# Patient Record
Sex: Male | Born: 1970 | Race: White | Hispanic: No | Marital: Married | Smoking: Current every day smoker
Health system: Southern US, Community
[De-identification: ages and names within clinical notes are randomized; demographics above are authoritative.]

## PROBLEM LIST (undated history)

## (undated) DIAGNOSIS — Z7901 Long term (current) use of anticoagulants: Secondary | ICD-10-CM

## (undated) DIAGNOSIS — I2699 Other pulmonary embolism without acute cor pulmonale: Secondary | ICD-10-CM

## (undated) DIAGNOSIS — K760 Fatty (change of) liver, not elsewhere classified: Secondary | ICD-10-CM

## (undated) HISTORY — PX: COLONOSCOPY: SHX174

## (undated) HISTORY — DX: Long term (current) use of anticoagulants: Z79.01

## (undated) HISTORY — PX: CHEST TUBE INSERTION: SHX231

## (undated) HISTORY — DX: Other pulmonary embolism without acute cor pulmonale: I26.99

## (undated) HISTORY — DX: Fatty (change of) liver, not elsewhere classified: K76.0

---

## 2002-09-22 ENCOUNTER — Emergency Department (HOSPITAL_COMMUNITY): Admission: EM | Admit: 2002-09-22 | Discharge: 2002-09-22 | Payer: Self-pay | Admitting: Emergency Medicine

## 2003-09-02 ENCOUNTER — Emergency Department (HOSPITAL_COMMUNITY): Admission: EM | Admit: 2003-09-02 | Discharge: 2003-09-02 | Payer: Self-pay | Admitting: *Deleted

## 2003-09-02 ENCOUNTER — Emergency Department (HOSPITAL_COMMUNITY): Admission: EM | Admit: 2003-09-02 | Discharge: 2003-09-02 | Payer: Self-pay | Admitting: Emergency Medicine

## 2003-10-02 ENCOUNTER — Emergency Department (HOSPITAL_COMMUNITY): Admission: EM | Admit: 2003-10-02 | Discharge: 2003-10-02 | Payer: Self-pay | Admitting: Emergency Medicine

## 2003-10-07 ENCOUNTER — Emergency Department (HOSPITAL_COMMUNITY): Admission: EM | Admit: 2003-10-07 | Discharge: 2003-10-07 | Payer: Self-pay | Admitting: Emergency Medicine

## 2005-02-21 ENCOUNTER — Inpatient Hospital Stay (HOSPITAL_COMMUNITY): Admission: EM | Admit: 2005-02-21 | Discharge: 2005-03-08 | Payer: Self-pay | Admitting: Emergency Medicine

## 2005-02-28 ENCOUNTER — Encounter (INDEPENDENT_AMBULATORY_CARE_PROVIDER_SITE_OTHER): Payer: Self-pay | Admitting: Specialist

## 2005-02-28 ENCOUNTER — Ambulatory Visit: Payer: Self-pay | Admitting: Emergency Medicine

## 2005-03-04 ENCOUNTER — Encounter: Payer: Self-pay | Admitting: Thoracic Surgery

## 2005-03-14 ENCOUNTER — Ambulatory Visit: Payer: Self-pay | Admitting: Emergency Medicine

## 2005-03-18 ENCOUNTER — Ambulatory Visit: Payer: Self-pay | Admitting: Cardiology

## 2005-03-20 ENCOUNTER — Encounter: Admission: RE | Admit: 2005-03-20 | Discharge: 2005-03-20 | Payer: Self-pay | Admitting: Thoracic Surgery

## 2005-03-22 ENCOUNTER — Ambulatory Visit: Payer: Self-pay | Admitting: *Deleted

## 2005-03-29 ENCOUNTER — Ambulatory Visit: Payer: Self-pay | Admitting: Cardiology

## 2005-04-09 ENCOUNTER — Encounter: Admission: RE | Admit: 2005-04-09 | Discharge: 2005-04-09 | Payer: Self-pay | Admitting: Thoracic Surgery

## 2005-04-11 ENCOUNTER — Ambulatory Visit: Payer: Self-pay | Admitting: Cardiology

## 2005-05-02 ENCOUNTER — Ambulatory Visit: Payer: Self-pay | Admitting: Cardiology

## 2005-05-22 ENCOUNTER — Ambulatory Visit (HOSPITAL_COMMUNITY): Admission: RE | Admit: 2005-05-22 | Discharge: 2005-05-22 | Payer: Self-pay | Admitting: Thoracic Surgery

## 2005-05-23 ENCOUNTER — Ambulatory Visit: Payer: Self-pay | Admitting: *Deleted

## 2005-06-12 ENCOUNTER — Encounter: Admission: RE | Admit: 2005-06-12 | Discharge: 2005-06-12 | Payer: Self-pay | Admitting: Thoracic Surgery

## 2005-06-26 ENCOUNTER — Ambulatory Visit: Payer: Self-pay | Admitting: *Deleted

## 2005-07-31 ENCOUNTER — Ambulatory Visit: Payer: Self-pay | Admitting: Cardiology

## 2005-08-28 ENCOUNTER — Ambulatory Visit: Payer: Self-pay | Admitting: *Deleted

## 2005-09-12 ENCOUNTER — Ambulatory Visit: Payer: Self-pay | Admitting: *Deleted

## 2005-09-18 ENCOUNTER — Encounter: Admission: RE | Admit: 2005-09-18 | Discharge: 2005-09-18 | Payer: Self-pay | Admitting: Thoracic Surgery

## 2005-10-18 ENCOUNTER — Ambulatory Visit: Payer: Self-pay | Admitting: Cardiology

## 2005-11-03 ENCOUNTER — Emergency Department (HOSPITAL_COMMUNITY): Admission: EM | Admit: 2005-11-03 | Discharge: 2005-11-03 | Payer: Self-pay | Admitting: Emergency Medicine

## 2005-11-19 ENCOUNTER — Ambulatory Visit: Payer: Self-pay | Admitting: Cardiology

## 2005-12-18 ENCOUNTER — Ambulatory Visit: Payer: Self-pay | Admitting: *Deleted

## 2006-01-22 ENCOUNTER — Ambulatory Visit: Payer: Self-pay | Admitting: Cardiology

## 2006-02-24 ENCOUNTER — Ambulatory Visit: Payer: Self-pay | Admitting: Cardiology

## 2006-03-25 ENCOUNTER — Ambulatory Visit: Payer: Self-pay | Admitting: Cardiology

## 2006-04-22 ENCOUNTER — Ambulatory Visit: Payer: Self-pay | Admitting: Cardiology

## 2006-05-08 ENCOUNTER — Ambulatory Visit: Payer: Self-pay | Admitting: Cardiology

## 2006-06-16 ENCOUNTER — Ambulatory Visit: Payer: Self-pay | Admitting: Cardiovascular Disease

## 2006-07-09 ENCOUNTER — Ambulatory Visit: Payer: Self-pay | Admitting: Cardiology

## 2006-08-08 ENCOUNTER — Ambulatory Visit: Payer: Self-pay | Admitting: Cardiology

## 2006-09-12 ENCOUNTER — Ambulatory Visit: Payer: Self-pay | Admitting: Cardiology

## 2006-10-16 ENCOUNTER — Ambulatory Visit: Payer: Self-pay | Admitting: Internal Medicine

## 2006-11-05 IMAGING — US US EXTREM LOW VENOUS BILAT
1 series · 13 of 24 positions shown · non-contrast
Comparison: none

CLINICAL DATA: History of pulmonary embolus with loculated right pleural effusion.  Diagnostic sample requested.  
ULTRASOUND CHEST:
CLINICAL DATA: Pulmonary embolus.  Swelling and leg pain. 
BILATERAL LOWER EXTREMITY VENOUS DOPPLER ULTRASOUND:
TECHNIQUE: Gray-scale sonography with compression, as well as color and duplex Doppler ultrasound, were performed to evaluate the deep venous system from the level of the common femoral vein through the popliteal and proximal calf veins.

[Series 1: unknown · 13 of 41 slices shown]
[im 1/41]
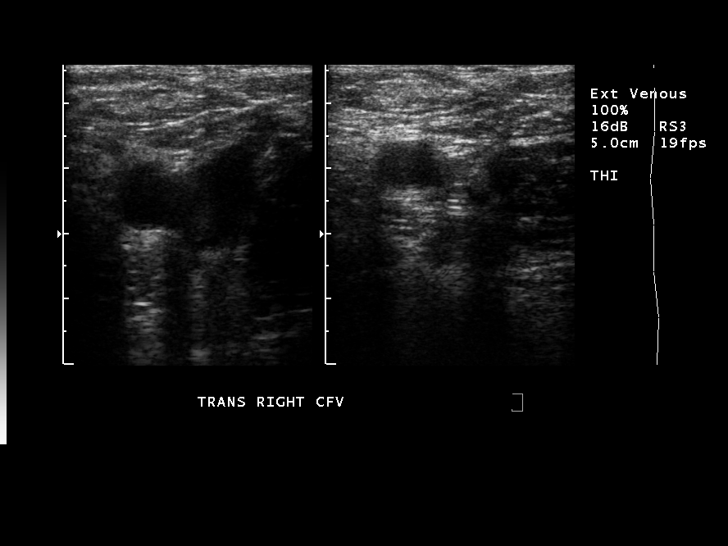
[im 4/41]
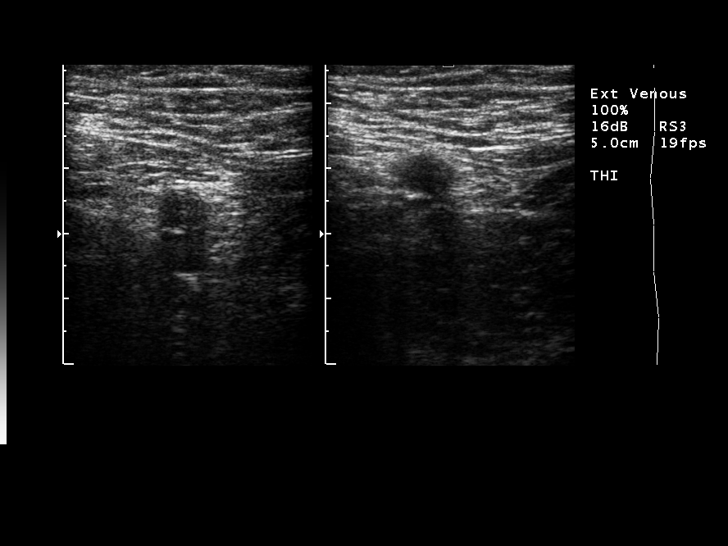
[im 7/41]
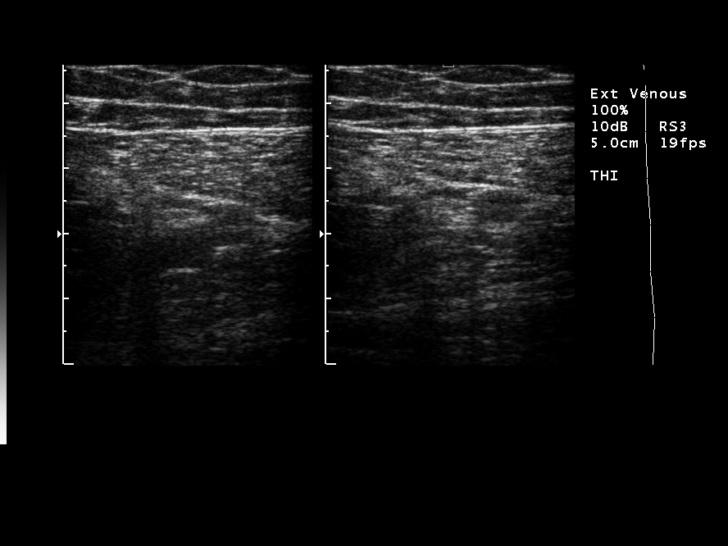
[im 11/41]
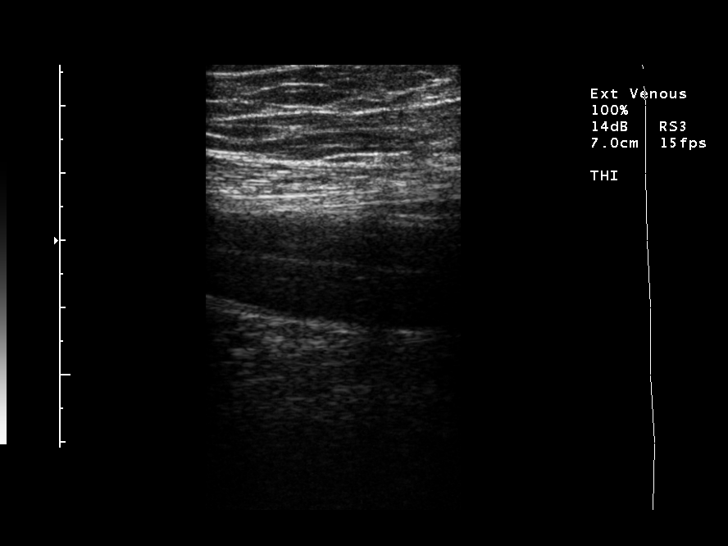
[im 14/41]
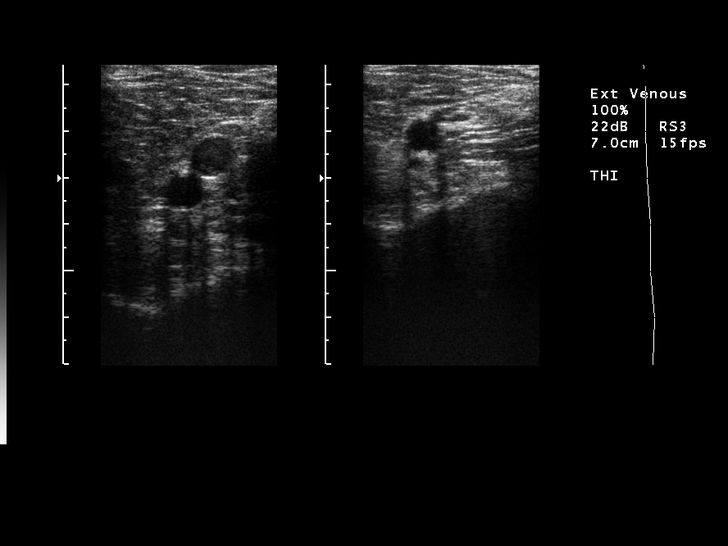
[im 18/41]
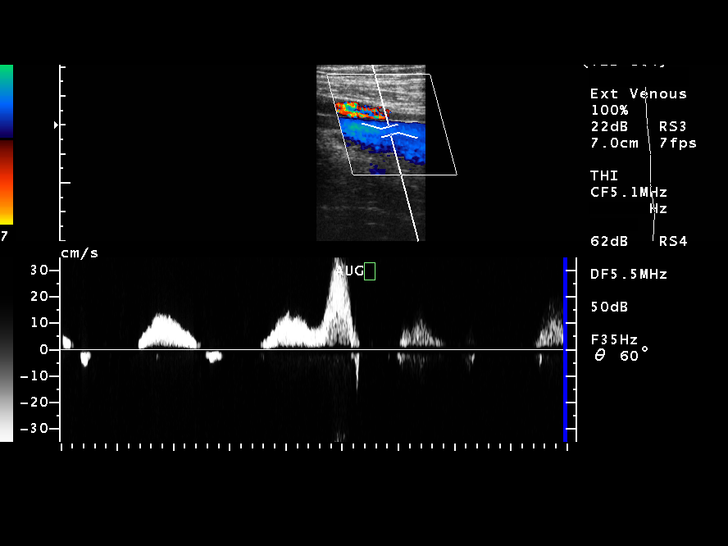
[im 21/41]
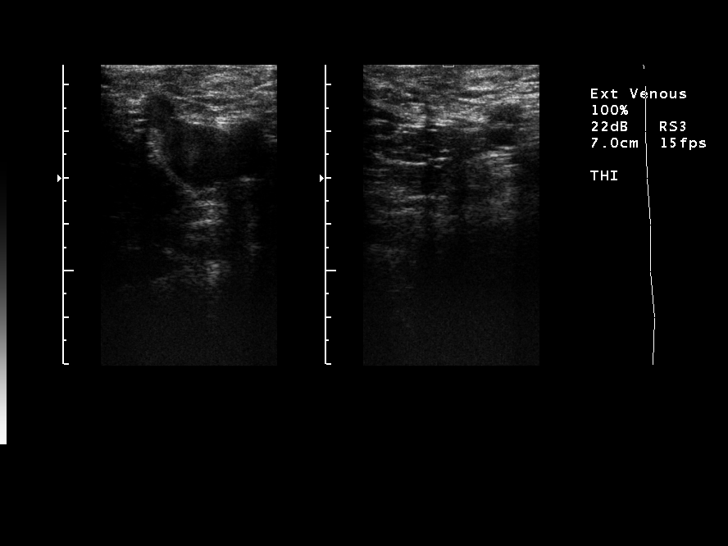
[im 23/41]
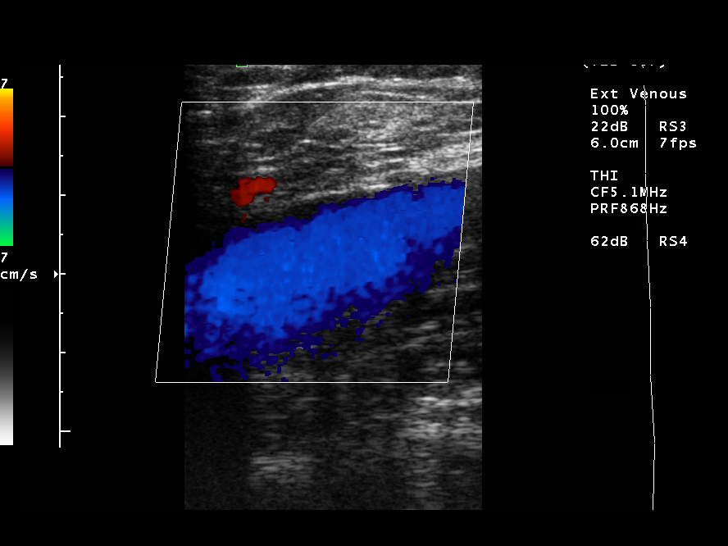
[im 27/41]
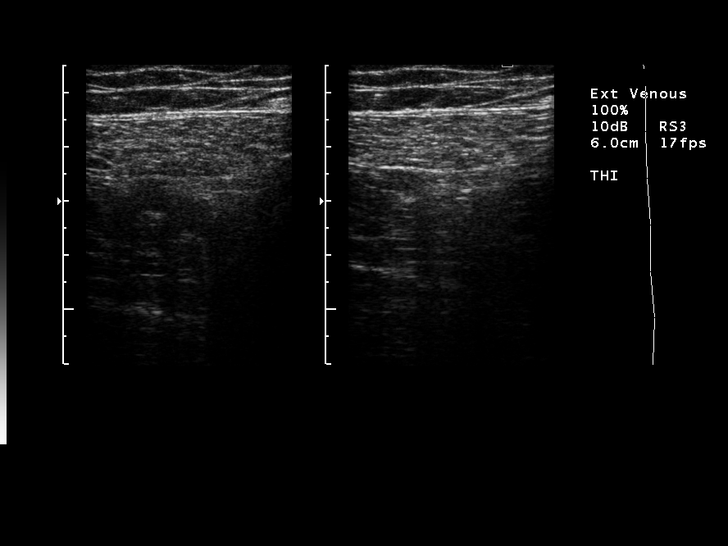
[im 30/41]
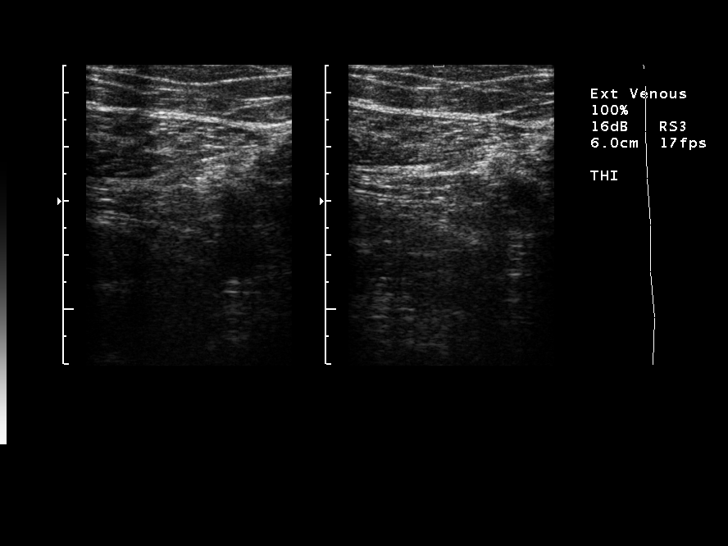
[im 34/41]
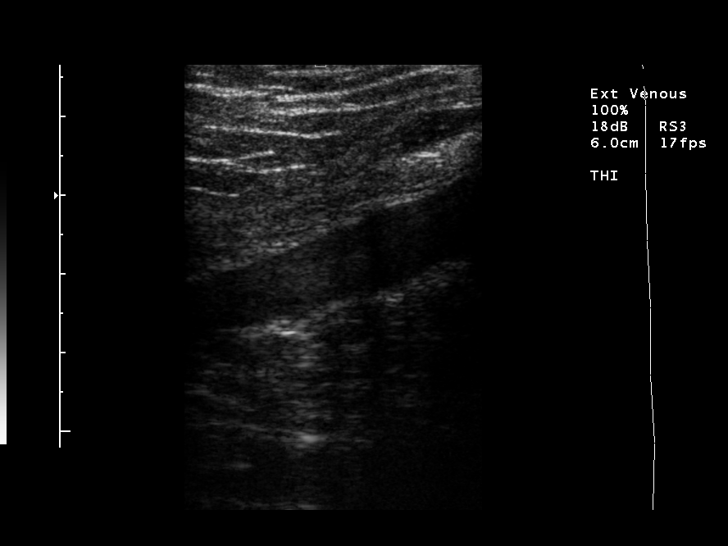
[im 37/41]
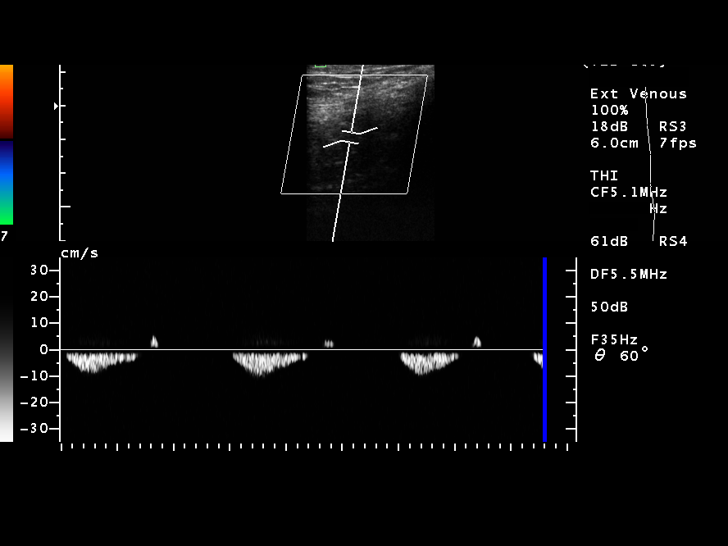
[im 41/41]
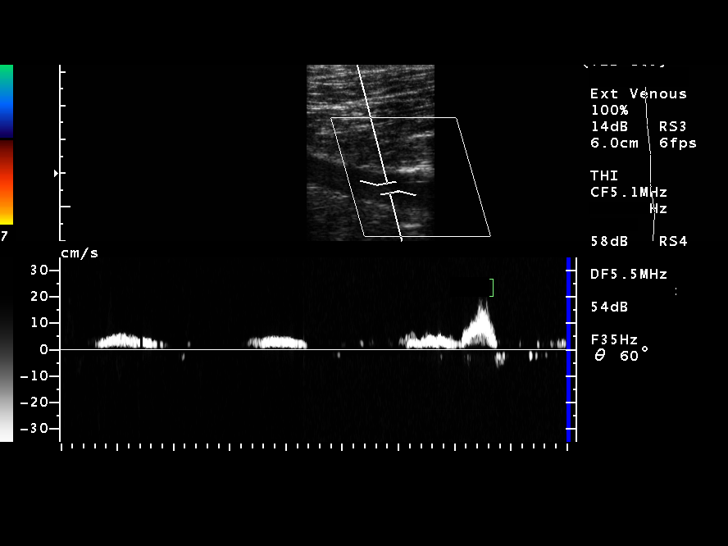

[13 of 24 positions shown; findings below may reference images not displayed]

FINDINGS: Limited ultrasound over the right hemithorax reveals a complex right pleural effusion which is loculated.  A cutaneous mark was placed in the upper posterior right hemithorax in the mid clavicular line.  
Procedure:  Patient was prepped and draped and typical sterile fashion.  1% Lidocaine was injected subcutaneously.  A Yueh catheter needle was inserted into the right pleural space without return of fluid.  The catheter was removed.  The patient tolerated the procedure well.  There were no immediate complications. Dr. Shaylee performed the entire procedure.
IMPRESSION: Unsuccessful ultrasound guided right thoracentesis due to complex nature of right pleural fluid.
FINDINGS: There is normal flow, compressibility, and augmentation within the common femoral, profunda femoral, superficial femoral, and popliteal veins bilaterally.
IMPRESSION: No evidence of deep venous thrombosis in the visualized venous structures of the lower extremities bilaterally.

## 2006-11-05 IMAGING — US US CHEST/MEDIASTINUM
1 series · 7 of 7 positions shown · non-contrast
Comparison: none

CLINICAL DATA: History of pulmonary embolus with loculated right pleural effusion.  Diagnostic sample requested.  
ULTRASOUND CHEST:
CLINICAL DATA: Pulmonary embolus.  Swelling and leg pain. 
BILATERAL LOWER EXTREMITY VENOUS DOPPLER ULTRASOUND:
TECHNIQUE: Gray-scale sonography with compression, as well as color and duplex Doppler ultrasound, were performed to evaluate the deep venous system from the level of the common femoral vein through the popliteal and proximal calf veins.

[Series 1: unknown · 0.34mm/px · 7 of 7 slices shown]
[im 1/7]
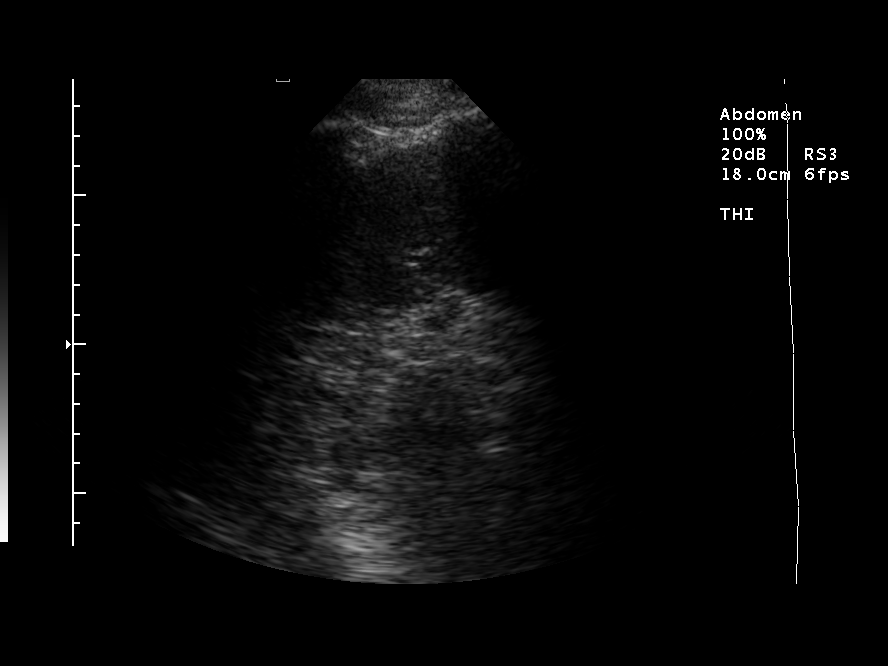
[im 2/7]
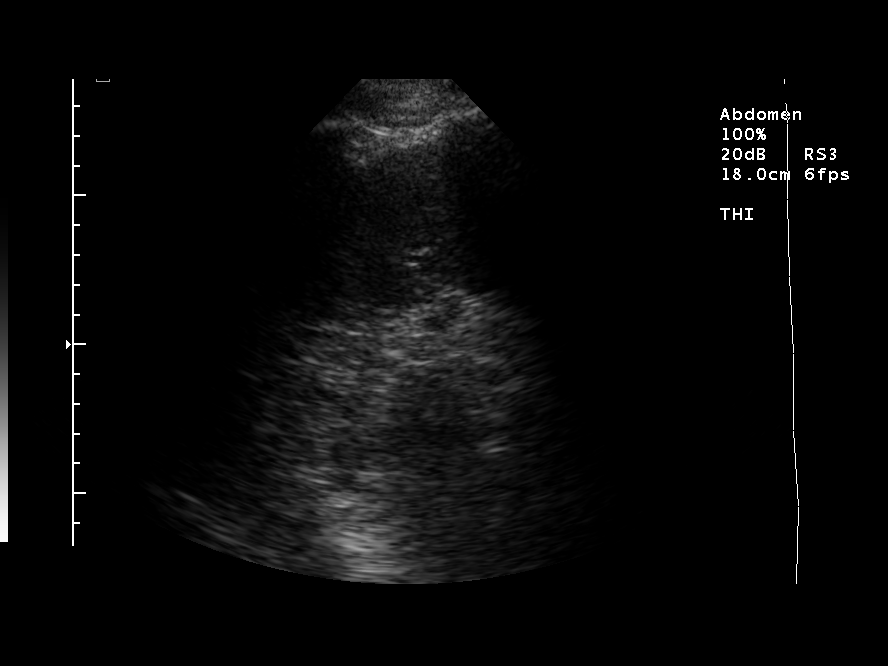
[im 3/7]
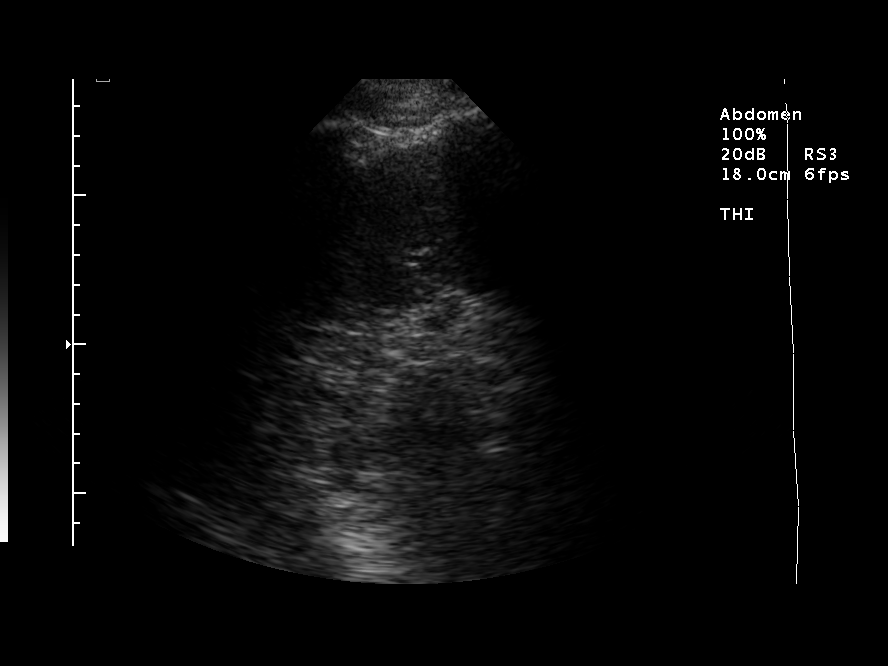
[im 4/7]
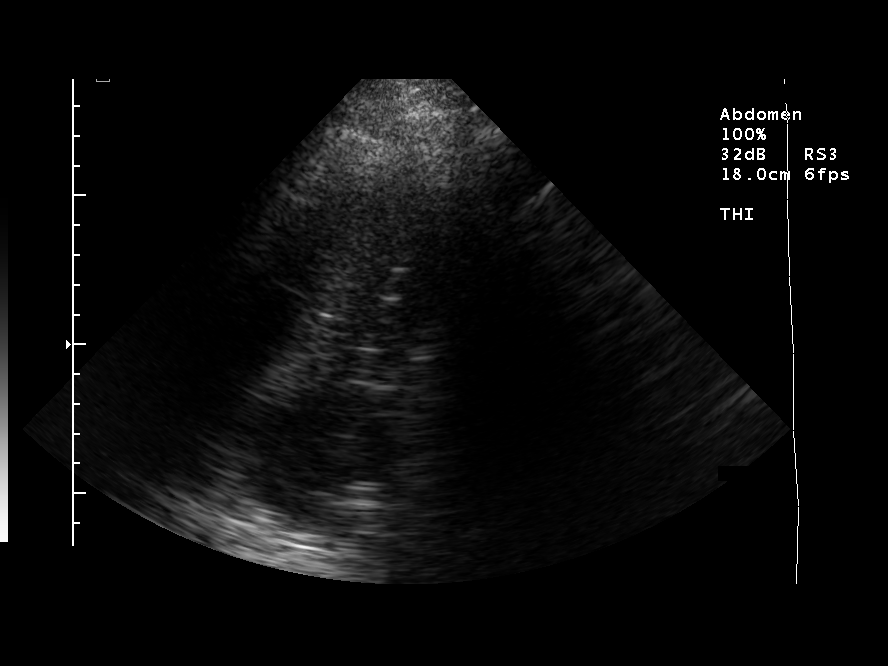
[im 5/7]
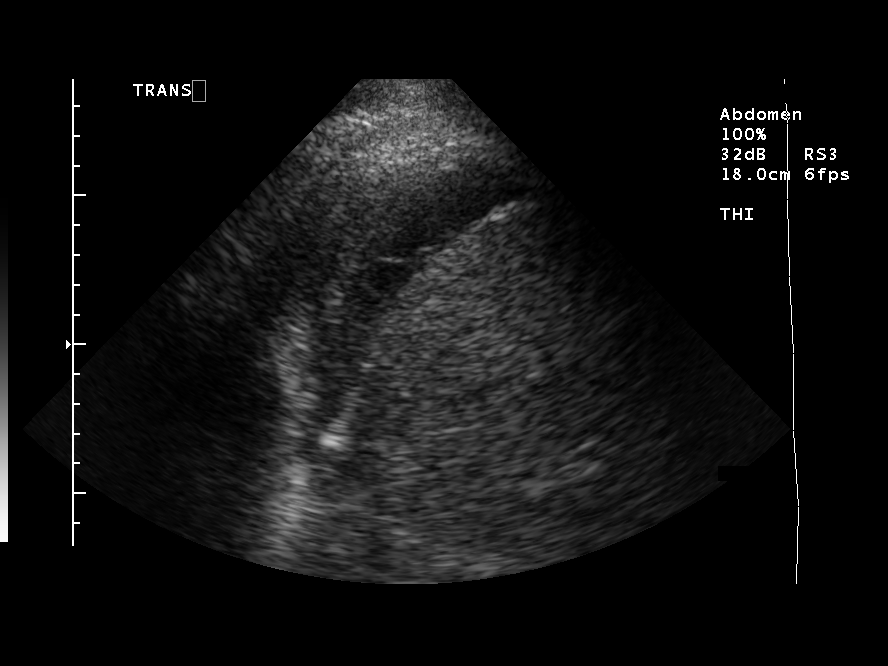
[im 6/7]
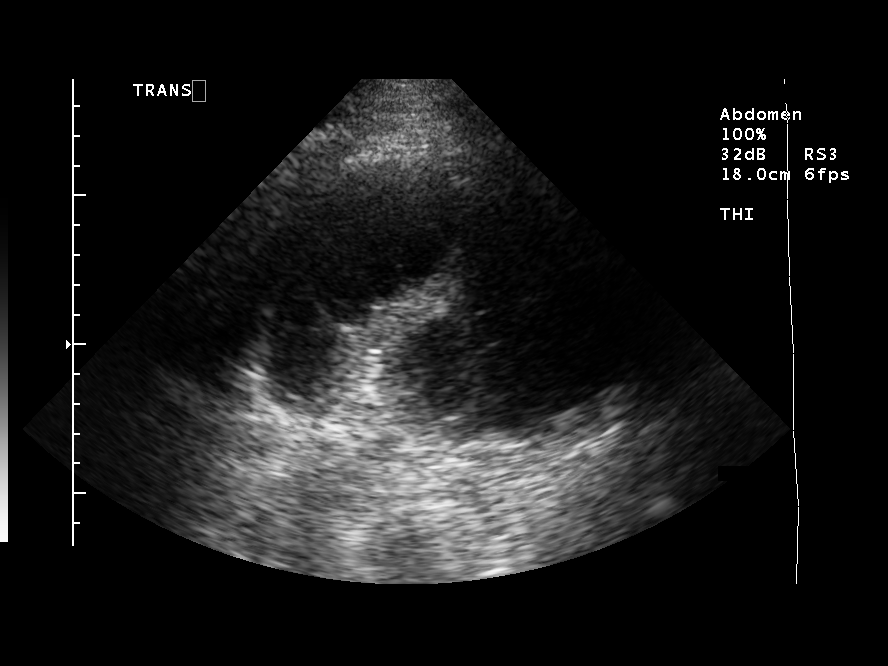
[im 7/7]
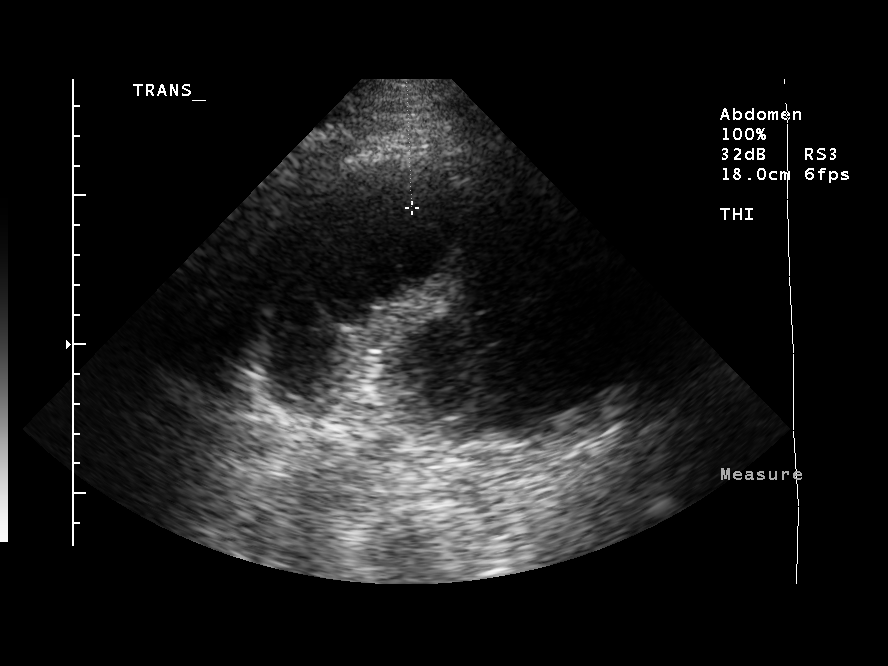

[7 of 7 positions shown; findings below may reference images not displayed]

FINDINGS: Limited ultrasound over the right hemithorax reveals a complex right pleural effusion which is loculated.  A cutaneous mark was placed in the upper posterior right hemithorax in the mid clavicular line.  
Procedure:  Patient was prepped and draped and typical sterile fashion.  1% Lidocaine was injected subcutaneously.  A Yueh catheter needle was inserted into the right pleural space without return of fluid.  The catheter was removed.  The patient tolerated the procedure well.  There were no immediate complications. Dr. Shaylee performed the entire procedure.
IMPRESSION: Unsuccessful ultrasound guided right thoracentesis due to complex nature of right pleural fluid.
FINDINGS: There is normal flow, compressibility, and augmentation within the common femoral, profunda femoral, superficial femoral, and popliteal veins bilaterally.
IMPRESSION: No evidence of deep venous thrombosis in the visualized venous structures of the lower extremities bilaterally.

## 2006-11-06 IMAGING — CR DG CHEST 1V PORT
1 series · 1 of 1 positions shown · non-contrast
Comparison: 02/24/2005

CLINICAL DATA: Right-sided chest tube placement.

PORTABLE CHEST - 1 VIEW:

[view not recorded]
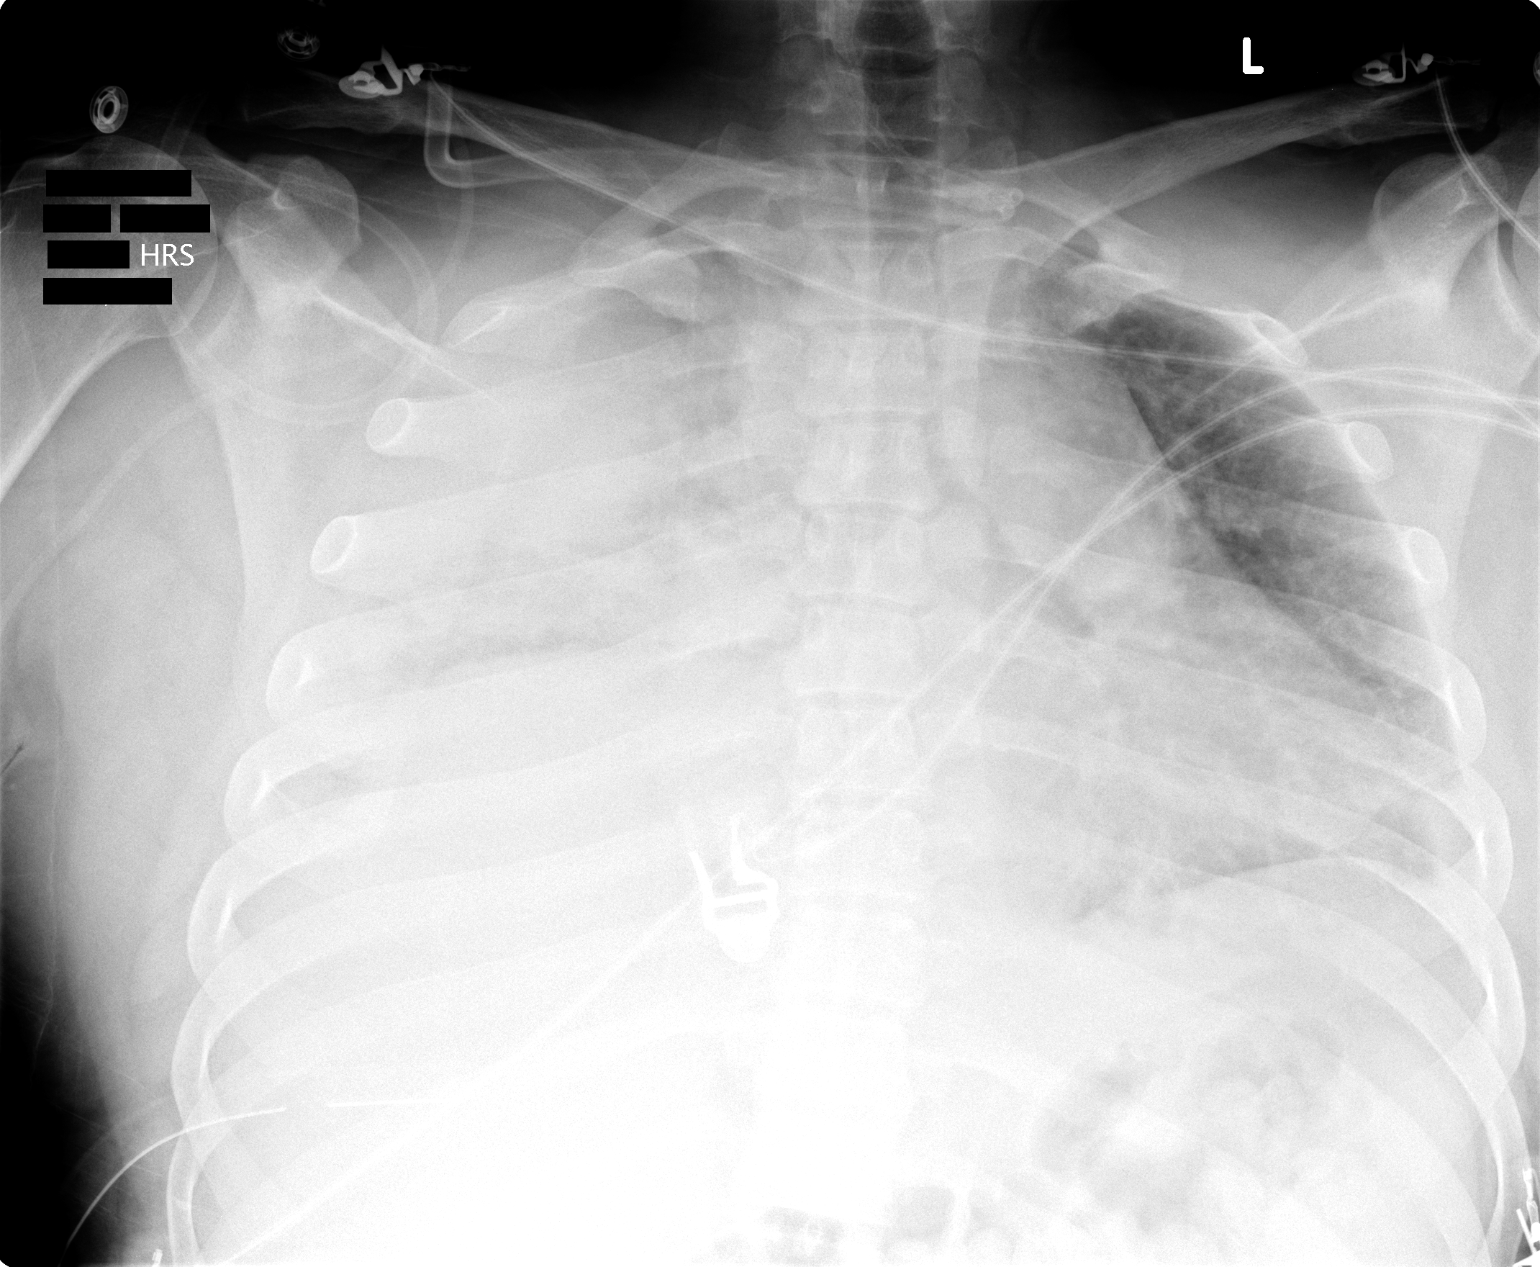

[1 of 1 positions shown; findings below may reference images not displayed]

FINDINGS: Right basilar chest tube has been placed. No pneumothorax. No
significant change in the large right effusion. Continued near complete
opacification of the right hemithorax.
IMPRESSION: Right basilar chest tube without pneumothorax. Minimal or no change currently of
the large right effusion

## 2006-11-08 IMAGING — CR DG CHEST 1V PORT
1 series · 1 of 1 positions shown · non-contrast
Comparison: none

CLINICAL DATA: Pre-op.  History of pulmonary embolism.
 PORTABLE CHEST - 1 VIEW ? 10/ [DATE] ? 1988 HOURS:

[view not recorded]
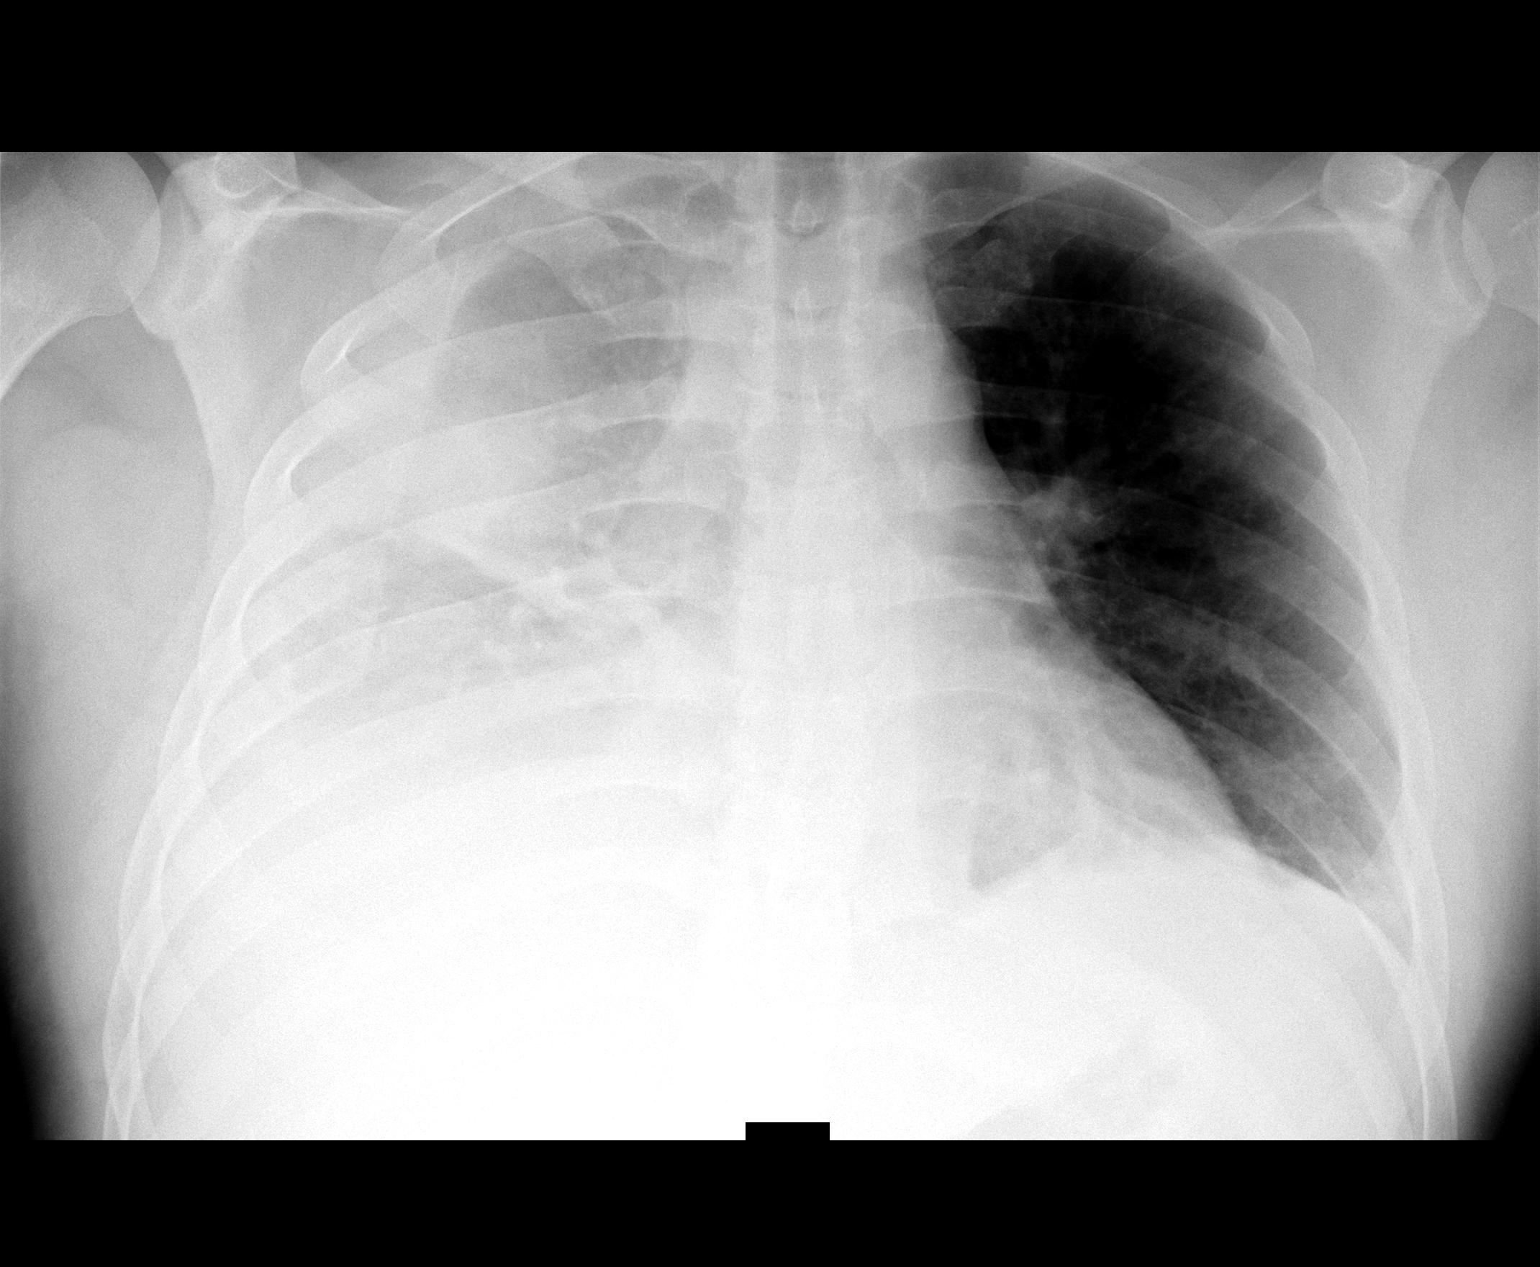

[1 of 1 positions shown; findings below may reference images not displayed]

FINDINGS: Compared to a portable chest x-ray from yesterday, there is little change in opacity throughout much of the right hemithorax with volume loss and effusion present.  Mild left basilar linear atelectasis is noted.  Mild cardiomegaly is stable.
IMPRESSION: No significant change in opacity of the right hemithorax with volume loss and effusion present.

## 2006-11-10 IMAGING — CR DG CHEST 1V PORT
1 series · 1 of 1 positions shown · non-contrast
Comparison: Portable chest x-ray yesterday and 02/28/2005.

CLINICAL DATA: A recent right pulmonary embolism. Right chest tubes in place for
large effusion.

PORTABLE CHEST - 1 VIEW  [DATE]/0995 9858 hours:

[view not recorded]
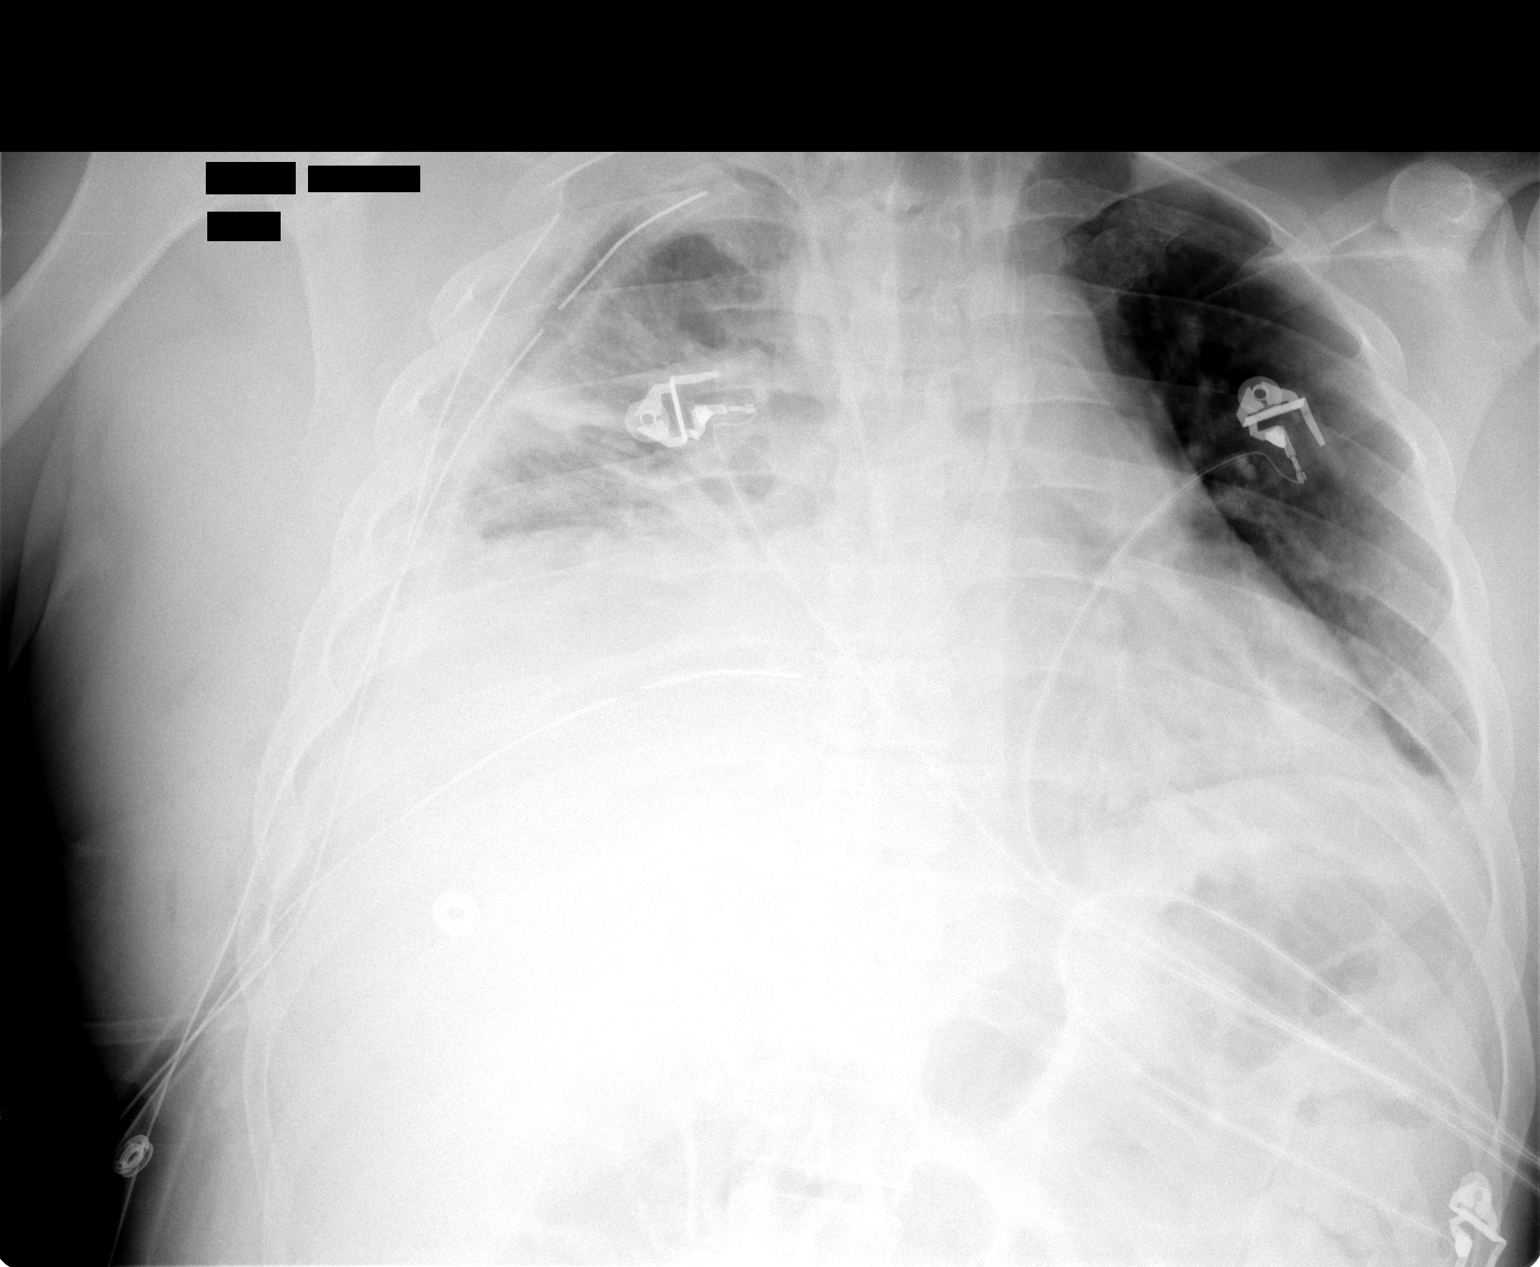

[1 of 1 positions shown; findings below may reference images not displayed]

FINDINGS: The 3 right chest tubes remain in place with no definite
pneumothorax. The pleural and parenchymal opacity on the right is unchanged.
Mild atelectasis at the left base is also unchanged. No new abnormalities are
detected. The right jugular central venous catheter tip remains the SVC.
IMPRESSION: No pneumothorax. Stable pleural-parenchymal opacities on the right and mild left
basilar atelectasis. No new abnormalities.

## 2006-11-17 ENCOUNTER — Ambulatory Visit: Payer: Self-pay | Admitting: Cardiology

## 2007-02-12 ENCOUNTER — Ambulatory Visit: Payer: Self-pay | Admitting: Cardiovascular Disease

## 2007-02-20 ENCOUNTER — Ambulatory Visit: Payer: Self-pay | Admitting: Cardiology

## 2007-03-16 ENCOUNTER — Ambulatory Visit: Payer: Self-pay | Admitting: Cardiology

## 2007-03-26 ENCOUNTER — Emergency Department (HOSPITAL_COMMUNITY): Admission: EM | Admit: 2007-03-26 | Discharge: 2007-03-26 | Payer: Self-pay | Admitting: Emergency Medicine

## 2007-05-12 ENCOUNTER — Ambulatory Visit: Payer: Self-pay | Admitting: Cardiovascular Disease

## 2007-05-27 ENCOUNTER — Emergency Department (HOSPITAL_COMMUNITY): Admission: EM | Admit: 2007-05-27 | Discharge: 2007-05-27 | Payer: Self-pay | Admitting: Emergency Medicine

## 2007-06-12 ENCOUNTER — Ambulatory Visit: Payer: Self-pay | Admitting: Cardiology

## 2007-07-10 ENCOUNTER — Ambulatory Visit: Payer: Self-pay | Admitting: Cardiology

## 2007-07-31 ENCOUNTER — Ambulatory Visit: Payer: Self-pay | Admitting: Cardiology

## 2007-09-03 ENCOUNTER — Ambulatory Visit: Payer: Self-pay | Admitting: Cardiology

## 2007-10-20 ENCOUNTER — Emergency Department (HOSPITAL_COMMUNITY): Admission: EM | Admit: 2007-10-20 | Discharge: 2007-10-20 | Payer: Self-pay | Admitting: Emergency Medicine

## 2007-10-20 ENCOUNTER — Ambulatory Visit: Payer: Self-pay | Admitting: Cardiology

## 2007-10-21 ENCOUNTER — Emergency Department (HOSPITAL_COMMUNITY): Admission: EM | Admit: 2007-10-21 | Discharge: 2007-10-21 | Payer: Self-pay | Admitting: Emergency Medicine

## 2007-11-17 ENCOUNTER — Ambulatory Visit: Payer: Self-pay | Admitting: Cardiology

## 2007-12-01 ENCOUNTER — Ambulatory Visit: Payer: Self-pay | Admitting: Cardiology

## 2007-12-30 ENCOUNTER — Ambulatory Visit: Payer: Self-pay | Admitting: Cardiology

## 2008-02-18 ENCOUNTER — Ambulatory Visit: Payer: Self-pay | Admitting: Cardiology

## 2008-04-04 ENCOUNTER — Ambulatory Visit: Payer: Self-pay | Admitting: Cardiology

## 2008-05-09 ENCOUNTER — Ambulatory Visit: Payer: Self-pay | Admitting: Cardiology

## 2008-06-23 ENCOUNTER — Ambulatory Visit: Payer: Self-pay | Admitting: Cardiology

## 2008-07-21 ENCOUNTER — Ambulatory Visit: Payer: Self-pay | Admitting: Cardiology

## 2008-08-18 ENCOUNTER — Ambulatory Visit: Payer: Self-pay | Admitting: Cardiology

## 2008-09-19 ENCOUNTER — Ambulatory Visit: Payer: Self-pay | Admitting: Cardiology

## 2008-10-24 ENCOUNTER — Ambulatory Visit: Payer: Self-pay | Admitting: Cardiology

## 2008-11-01 ENCOUNTER — Emergency Department (HOSPITAL_COMMUNITY): Admission: EM | Admit: 2008-11-01 | Discharge: 2008-11-01 | Payer: Self-pay | Admitting: Emergency Medicine

## 2008-11-21 ENCOUNTER — Ambulatory Visit: Payer: Self-pay | Admitting: Cardiology

## 2008-12-19 ENCOUNTER — Encounter: Payer: Self-pay | Admitting: *Deleted

## 2008-12-26 ENCOUNTER — Ambulatory Visit: Payer: Self-pay | Admitting: Cardiology

## 2009-01-23 ENCOUNTER — Ambulatory Visit: Payer: Self-pay | Admitting: Cardiology

## 2009-01-23 LAB — CONVERTED CEMR LAB: POC INR: 2.5

## 2009-03-01 ENCOUNTER — Ambulatory Visit: Payer: Self-pay | Admitting: Cardiology

## 2009-03-29 ENCOUNTER — Ambulatory Visit: Payer: Self-pay | Admitting: Cardiology

## 2009-03-29 LAB — CONVERTED CEMR LAB: POC INR: 1.9

## 2009-05-03 ENCOUNTER — Ambulatory Visit: Payer: Self-pay | Admitting: Cardiology

## 2009-05-03 LAB — CONVERTED CEMR LAB: POC INR: 2

## 2009-06-01 ENCOUNTER — Ambulatory Visit: Payer: Self-pay | Admitting: Cardiovascular Disease

## 2009-06-01 LAB — CONVERTED CEMR LAB: POC INR: 2.1

## 2009-06-29 ENCOUNTER — Encounter (INDEPENDENT_AMBULATORY_CARE_PROVIDER_SITE_OTHER): Payer: Self-pay | Admitting: *Deleted

## 2009-07-05 ENCOUNTER — Ambulatory Visit: Payer: Self-pay | Admitting: Cardiology

## 2009-07-05 LAB — CONVERTED CEMR LAB: POC INR: 2.1

## 2009-08-03 ENCOUNTER — Ambulatory Visit: Payer: Self-pay | Admitting: Cardiology

## 2009-08-03 LAB — CONVERTED CEMR LAB: POC INR: 2.1

## 2009-08-31 ENCOUNTER — Ambulatory Visit: Payer: Self-pay | Admitting: Cardiology

## 2009-08-31 LAB — CONVERTED CEMR LAB: POC INR: 2.6

## 2009-09-13 DIAGNOSIS — I2699 Other pulmonary embolism without acute cor pulmonale: Secondary | ICD-10-CM | POA: Insufficient documentation

## 2009-09-14 ENCOUNTER — Ambulatory Visit: Payer: Self-pay | Admitting: Cardiology

## 2009-09-14 DIAGNOSIS — R1084 Generalized abdominal pain: Secondary | ICD-10-CM

## 2009-09-14 DIAGNOSIS — R1011 Right upper quadrant pain: Secondary | ICD-10-CM

## 2009-09-15 ENCOUNTER — Encounter: Payer: Self-pay | Admitting: Adult Health

## 2009-09-18 ENCOUNTER — Encounter: Payer: Self-pay | Admitting: Cardiology

## 2009-09-18 LAB — CONVERTED CEMR LAB
Albumin: 4.7 g/dL (ref 3.5–5.2)
BUN: 12 mg/dL (ref 6–23)
Glucose, Bld: 90 mg/dL (ref 70–99)

## 2009-09-19 ENCOUNTER — Encounter (INDEPENDENT_AMBULATORY_CARE_PROVIDER_SITE_OTHER): Payer: Self-pay | Admitting: *Deleted

## 2009-09-22 ENCOUNTER — Ambulatory Visit (HOSPITAL_COMMUNITY): Admission: RE | Admit: 2009-09-22 | Discharge: 2009-09-22 | Payer: Self-pay | Admitting: Cardiology

## 2009-09-28 ENCOUNTER — Ambulatory Visit: Payer: Self-pay | Admitting: Cardiology

## 2009-09-28 LAB — CONVERTED CEMR LAB: POC INR: 3.5

## 2009-11-01 ENCOUNTER — Ambulatory Visit: Payer: Self-pay | Admitting: Cardiology

## 2009-11-01 LAB — CONVERTED CEMR LAB: POC INR: 3.7

## 2009-11-22 ENCOUNTER — Ambulatory Visit: Payer: Self-pay | Admitting: Cardiology

## 2009-12-20 ENCOUNTER — Ambulatory Visit: Payer: Self-pay | Admitting: Cardiology

## 2009-12-20 LAB — CONVERTED CEMR LAB: POC INR: 2

## 2010-01-17 ENCOUNTER — Encounter (INDEPENDENT_AMBULATORY_CARE_PROVIDER_SITE_OTHER): Payer: Self-pay | Admitting: *Deleted

## 2010-01-22 ENCOUNTER — Ambulatory Visit: Payer: Self-pay | Admitting: Cardiology

## 2010-01-22 LAB — CONVERTED CEMR LAB: POC INR: 2.1

## 2010-02-19 ENCOUNTER — Encounter (INDEPENDENT_AMBULATORY_CARE_PROVIDER_SITE_OTHER): Payer: Self-pay | Admitting: *Deleted

## 2010-02-21 ENCOUNTER — Encounter (INDEPENDENT_AMBULATORY_CARE_PROVIDER_SITE_OTHER): Payer: Self-pay | Admitting: *Deleted

## 2010-02-22 ENCOUNTER — Ambulatory Visit: Payer: Self-pay | Admitting: Cardiology

## 2010-02-22 LAB — CONVERTED CEMR LAB: POC INR: 2

## 2010-03-22 ENCOUNTER — Ambulatory Visit: Payer: Self-pay | Admitting: Cardiology

## 2010-03-22 LAB — CONVERTED CEMR LAB: POC INR: 2.2

## 2010-04-19 ENCOUNTER — Ambulatory Visit: Payer: Self-pay | Admitting: Cardiology

## 2010-04-19 LAB — CONVERTED CEMR LAB: POC INR: 1.8

## 2010-05-03 ENCOUNTER — Ambulatory Visit: Payer: Self-pay | Admitting: Cardiology

## 2010-05-03 LAB — CONVERTED CEMR LAB: POC INR: 2

## 2010-05-31 ENCOUNTER — Encounter: Payer: Self-pay | Admitting: Cardiology

## 2010-05-31 ENCOUNTER — Ambulatory Visit: Admission: RE | Admit: 2010-05-31 | Discharge: 2010-05-31 | Payer: Self-pay | Source: Home / Self Care

## 2010-05-31 LAB — CONVERTED CEMR LAB: POC INR: 2.3

## 2010-06-05 NOTE — Medication Information (Signed)
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 no    Recent/future dental: no  Any missed doses?: no       Is patient compliant with meds? yes       Allergies: No Known Drug Allergies  Anticoagulation Management History:      The patient is taking warfarin and comes in today for a routine follow up visit.  Negative risk factors for bleeding include an age less than 35 years old.  The bleeding index is 'low risk'.  Negative CHADS2 values include Age > 65 years old.  The start date was 03/08/2005.  Anticoagulation responsible provider: Dietrich Pates MD, Molly Maduro.  INR POC: 3.7.  Cuvette Lot#: [id number].  Exp: 10/11.    Anticoagulation Management Assessment/Plan:      The patient's current anticoagulation dose is Coumadin 5 mg tabs: Take 1 tablet by mouth once a day as directed by [facility]ain clinic.  The target INR is 2 - 3.  The next INR is due 11/22/2009.  Anticoagulation instructions were given to patient.  Results were reviewed/authorized by Vashti Hey, RN.  He was notified by Vashti Hey RN.         Prior Anticoagulation Instructions: INR 3.5 Hold coumadin tonight then decrease dose to 5mg  once daily   Current Anticoagulation Instructions: INR 3.7 Hold coumadin tonight then decrease dose to 5mg  once daily except 2.5mg  on Mondays and Fridays

## 2010-06-05 NOTE — Medication Information (Signed)
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 no    Recent/future dental: no  Any missed doses?: no       Is patient compliant with meds? yes       Anticoagulation Management History:      The patient is taking warfarin and comes in today for a routine follow up visit.  Negative risk factors for bleeding include an age less than 78 years old.  The bleeding index is 'low risk'.  Negative CHADS2 values include Age > 68 years old.  The start date was 03/08/2005.  Anticoagulation responsible provider: Dietrich Pates MD, Molly Maduro.  INR POC: 2.1.  Cuvette Lot#: [id number].  Exp: 10/11.    Anticoagulation Management Assessment/Plan:      The target INR is 2 - 3.  The next INR is due 08/31/2009.  Anticoagulation instructions were given to patient.  Results were reviewed/authorized by Vashti Hey, RN.  He was notified by Vashti Hey RN.         Prior Anticoagulation Instructions: INR 2.1 Continue coumadin 5mg  once daily except 7.5mg  on Wednesdays  Current Anticoagulation Instructions: Same as Prior Instructions.

## 2010-06-05 NOTE — Medication Information (Signed)
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 no    Recent/future dental: no  Any missed doses?: no       Is patient compliant with meds? yes       Anticoagulation Management History:      The patient is taking warfarin and comes in today for a routine follow up visit.  Negative risk factors for bleeding include an age less than 41 years old.  The bleeding index is 'low risk'.  Negative CHADS2 values include Age > 9 years old.  The start date was 03/08/2005.  Anticoagulation responsible provider: Eden Emms MD, Theron Arista.  INR POC: 2.1.  Cuvette Lot#: [id number].  Exp: 10/11.    Anticoagulation Management Assessment/Plan:      The target INR is 2 - 3.  The next INR is due 06/29/2009.  Anticoagulation instructions were given to patient.  Results were reviewed/authorized by Vashti Hey, RN.  He was notified by Vashti Hey RN.         Prior Anticoagulation Instructions: INR 2.0 Take coumadin 2 tablets tonight then resume 1 tablet once daily except 1 1/2 tablets on Wednesdays  Current Anticoagulation Instructions: INR 2.1 Continue coumadin 5mg  once daily except 7.5mg  on Wednesdays

## 2010-06-05 NOTE — Medication Information (Signed)
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 no    Recent/future dental: no  Any missed doses?: no       Is patient compliant with meds? yes       Allergies: No Known Drug Allergies  Anticoagulation Management History:      The patient is taking warfarin and comes in today for a routine follow up visit.  Negative risk factors for bleeding include an age less than 56 years old.  The bleeding index is 'low risk'.  Negative CHADS2 values include Age > 78 years old.  The start date was 03/08/2005.  Anticoagulation responsible provider: Daleen Squibb MD, Maisie Fus.  INR POC: 2.0.  Cuvette Lot#: [id number].  Exp: 10/11.    Anticoagulation Management Assessment/Plan:      The patient's current anticoagulation dose is Coumadin 5 mg tabs: Take 1 tablet by mouth once a day as directed by coumdain clinic.  The target INR is 2 - 3.  The next INR is due 01/17/2010.  Anticoagulation instructions were given to patient.  Results were reviewed/authorized by Vashti Hey, RN.  He was notified by Vashti Hey RN.         Prior Anticoagulation Instructions: INR 2.0 Increase coumadin to 5mg  once daily except 2.5mg  on Mondays  Current Anticoagulation Instructions: INR 2.0 Take coumadin 1 1/2 tablets tonight then resume 1 tablet once daily except 1/2 tablet on Mondays

## 2010-06-05 NOTE — Letter (Signed)
Summary: Appointment - Missed  [facility] [place]  [address] [place] [address]   Phone: [phone]  Fax: [phone]     February 21, 2010 MRN: [id number]   KYLLE LALL [address]  [address] [place], [place]  [address]   Dear Mr. KASLER,  Our records indicate you missed your appointment on     02/21/10     [facility]           It is very important that we reach you to reschedule this appointment. We look forward to participating in your health care needs. Please contact us at the number listed above at your earliest convenience to reschedule this appointment.     Sincerely,    Glass blower/designer

## 2010-06-05 NOTE — Letter (Signed)
Summary: Appointment - Missed  [facility] [place]  [address], [place] [address]   Phone: [phone]  Fax: [phone]     February 19, 2010 MRN: [id number]   Andrew [name] [address]  [address] [place], [place]  [address]   Dear Andrew [name],  Our records indicate you missed your appointment on          02/19/10 [facility]                     It is very important that we reach you to reschedule this appointment. We look forward to participating in your health care needs. Please contact us at the number listed above at your earliest convenience to reschedule this appointment.     Sincerely,    Glass blower/designer

## 2010-06-05 NOTE — Letter (Signed)
Summary: [place] [facility]  [address] [place] [address]   Phone: [phone]  Fax: [phone]      Sep 19, 2009 MRN: [id number]   Andrew [name] [address]  [address] [place]  [address]   Dear [name],  Your test ordered by Selena Batten has been reviewed by your physician (or physician assistant) and was found to be normal or stable. Your physician (or physician assistant) felt no changes were needed at this time.  ____ Echocardiogram  ____ Cardiac Stress Test  __x__ Lab Work  ____ Peripheral vascular study of arms, legs or neck  ____ CT scan or X-ray  ____ Lung or Breathing test  ____ Other:  No change in medical treatment at this time, per Dr. Dietrich Pates.  Enclosed is a copy of your labwork for your records.   Thank you, ammy Rayleen Wyrick RN    [place] Bing, MD, F.A.C.Gaylord Shih, MD, F.A.C.C Lewayne Bunting, MD, F.A.C.C Nona Dell, MD, F.A.C.C Charlton Haws, MD, Lenise Arena.C.C

## 2010-06-05 NOTE — Medication Information (Signed)
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 no    Recent/future dental: no  Any missed doses?: no       Is patient compliant with meds? yes       Anticoagulation Management History:      The patient is taking warfarin and comes in today for a routine follow up visit.  Negative risk factors for bleeding include an age less than 70 years old.  The bleeding index is 'low risk'.  Negative CHADS2 values include Age > 61 years old.  The start date was 03/08/2005.  Anticoagulation responsible provider: Dietrich Pates MD, Molly Maduro.  INR POC: 2.6.  Cuvette Lot#: [id number].  Exp: 10/11.    Anticoagulation Management Assessment/Plan:      The patient's current anticoagulation dose is Coumadin 5 mg tabs: Take 1 tablet by mouth once a day as directed by coumdain clinic.  The target INR is 2 - 3.  The next INR is due 09/28/2009.  Anticoagulation instructions were given to patient.  Results were reviewed/authorized by Vashti Hey, RN.  He was notified by Vashti Hey RN.         Prior Anticoagulation Instructions: INR 2.1 Continue coumadin 5mg  once daily except 7.5mg  on Wednesdays  Current Anticoagulation Instructions: INR 2.6 Continue coumadin 5mg  once daily except 7.5mg  on Wednesdays

## 2010-06-05 NOTE — Medication Information (Signed)
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 no    Recent/future dental: no  Any missed doses?: no       Is patient compliant with meds? yes       Anticoagulation Management History:      The patient is taking warfarin and comes in today for a routine follow up visit.  Negative risk factors for bleeding include an age less than 41 years old.  The bleeding index is 'low risk'.  Negative CHADS2 values include Age > 58 years old.  The start date was 03/08/2005.  Anticoagulation responsible provider: Dietrich Pates MD, Molly Maduro.  INR POC: 2.1.  Cuvette Lot#: [id number].  Exp: 10/11.    Anticoagulation Management Assessment/Plan:      The target INR is 2 - 3.  The next INR is due 08/03/2009.  Anticoagulation instructions were given to patient.  Results were reviewed/authorized by Vashti Hey, RN.  He was notified by Vashti Hey RN.         Prior Anticoagulation Instructions: INR 2.1 Continue coumadin 5mg  once daily except 7.5mg  on Wednesdays  Current Anticoagulation Instructions: Same as Prior Instructions.

## 2010-06-05 NOTE — Letter (Signed)
Summary: Appointment - Missed  [facility] [place]  [address] [place] [address]   Phone: [phone]  Fax: [phone]     June 29, 2009 MRN: [id number]   Andrew [name] [address]  [address] [place], [place]  [address]   Dear Andrew [name],  Our records indicate you missed your appointment on        06/29/09       [facility]    It is very important that we reach you to reschedule this appointment. We look forward to participating in your health care needs. Please contact us at the number listed above at your earliest convenience to reschedule this appointment.     Sincerely,    Glass blower/designer

## 2010-06-05 NOTE — Letter (Signed)
Summary: Appointment - Missed  [facility]     [place], [place]    Phone:   Fax:      January 17, 2010 MRN: [id number]   PEARLEY BARANEK [address]  [address] [place], [place]  [address]   Dear Mr. BABA,  Our records indicate you missed your appointment on            01/17/10 [facility]          It is very important that we reach you to reschedule this appointment. We look forward to participating in your health care needs. Please contact us at the number listed above at your earliest convenience to reschedule this appointment.     Sincerely,    Glass blower/designer

## 2010-06-05 NOTE — Medication Information (Signed)
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 no    Recent/future dental: no  Any missed doses?: no       Is patient compliant with meds? yes       Allergies: No Known Drug Allergies  Anticoagulation Management History:      The patient is taking warfarin and comes in today for a routine follow up visit.  Negative risk factors for bleeding include an age less than 44 years old.  The bleeding index is 'low risk'.  Negative CHADS2 values include Age > 30 years old.  The start date was 03/08/2005.  Anticoagulation responsible provider: Daleen Squibb MD, Maisie Fus.  INR POC: 2.0.  Cuvette Lot#: [id number].  Exp: 10/11.    Anticoagulation Management Assessment/Plan:      The patient's current anticoagulation dose is Coumadin 5 mg tabs: Take 1 tablet by mouth once a day as directed by cou[facility]ain clinic.  The target INR is 2 - 3.  The next INR is due 12/20/2009.  Anticoagulation instructions were given to patient.  Results were reviewed/authorized by Vashti Hey, RN.  He was notified by Vashti Hey RN.         Prior Anticoagulation Instructions: INR 3.7 Hold coumadin tonight then decrease dose to 5mg  once daily except 2.5mg  on Mondays and Fridays  Current Anticoagulation Instructions: INR 2.0 Increase coumadin to 5mg  once daily except 2.5mg  on Mondays

## 2010-06-05 NOTE — Medication Information (Signed)
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 no    Recent/future dental: no  Any missed doses?: no       Is patient compliant with meds? yes       Allergies: No Known Drug Allergies  Anticoagulation Management History:      The patient is taking warfarin and comes in today for a routine follow up visit.  Negative risk factors for bleeding include an age less than 52 years old.  The bleeding index is 'low risk'.  Negative CHADS2 values include Age > 54 years old.  The start date was 03/08/2005.  Anticoagulation responsible provider: Diona Browner MD, Remi Deter.  INR POC: 3.5.  Cuvette Lot#: [id number].  Exp: 10/11.    Anticoagulation Management Assessment/Plan:      The patient's current anticoagulation dose is Coumadin 5 mg tabs: Take 1 tablet by mouth once a day as directed by coumdain clinic.  The target INR is 2 - 3.  The next INR is due 10/18/2009.  Anticoagulation instructions were given to patient.  Results were reviewed/authorized by Vashti Hey, RN.  He was notified by Vashti Hey RN.         Prior Anticoagulation Instructions: INR 2.6 Continue coumadin 5mg  once daily except 7.5mg  on Wednesdays  Current Anticoagulation Instructions: INR 3.5 Hold coumadin tonight then decrease dose to 5mg  once daily

## 2010-06-05 NOTE — Medication Information (Signed)
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 no    Recent/future dental: no  Any missed doses?: no       Is patient compliant with meds? yes       Allergies: No Known Drug Allergies  Anticoagulation Management History:      The patient is taking warfarin and comes in today for a routine follow up visit.  Negative risk factors for bleeding include an age less than 92 years old.  The bleeding index is 'low risk'.  Negative CHADS2 values include Age > 41 years old.  The start date was 03/08/2005.  Anticoagulation responsible Jaritza Duignan: Dietrich Pates MD, Molly Maduro.  INR POC: 2.0.  Cuvette Lot#: [id number].  Exp: 10/11.    Anticoagulation Management Assessment/Plan:      The patient's current anticoagulation dose is Coumadin 5 mg tabs: Take 1 tablet by mouth once a day as directed by coumdain clinic.  The target INR is 2 - 3.  The next INR is due 03/22/2010.  Anticoagulation instructions were given to patient.  Results were reviewed/authorized by Vashti Hey, RN.  He was notified by Vashti Hey RN.         Prior Anticoagulation Instructions: INR 2.1 Continue coumadin 5mg  once daily except 2.5mg  on Mondays  Current Anticoagulation Instructions: INR 2.0 Increase coumadin to 5mg  once daily

## 2010-06-05 NOTE — Medication Information (Signed)
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 no    Recent/future dental: no  Any missed doses?: no       Is patient compliant with meds? yes       Allergies: No Known Drug Allergies  Anticoagulation Management History:      The patient is taking warfarin and comes in today for a routine follow up visit.  Negative risk factors for bleeding include an age less than 71 years old.  The bleeding index is 'low risk'.  Negative CHADS2 values include Age > 7 years old.  The start date was 03/08/2005.  Anticoagulation responsible provider: Dietrich Pates MD, Molly Maduro.  INR POC: 2.1.  Cuvette Lot#: [id number].  Exp: 10/11.    Anticoagulation Management Assessment/Plan:      The patient's current anticoagulation dose is Coumadin 5 mg tabs: Take 1 tablet by mouth once a day as directed by coumdain clinic.  The target INR is 2 - 3.  The next INR is due 02/19/2010.  Anticoagulation instructions were given to patient.  Results were reviewed/authorized by Vashti Hey, RN.  He was notified by Vashti Hey RN.         Prior Anticoagulation Instructions: INR 2.0 Take coumadin 1 1/2 tablets tonight then resume 1 tablet once daily except 1/2 tablet on Mondays  Current Anticoagulation Instructions: INR 2.1 Continue coumadin 5mg  once daily except 2.5mg  on Mondays

## 2010-06-05 NOTE — Assessment & Plan Note (Signed)
Summary: F2Y      Allergies Added: NKDA  Visit Type:  Follow-up Primary Provider:  NO PMD  CC:  LEFT LEG PAIN.  History of Present Illness: Mr. Gaertner is a 40 y/o CM who we are seeing for annual follow up for history of pulmonary embolism associated with lupus anticoagulant.  He also had a loculated pleural effusion requiring operative intervention.  He is followed by our coumadin clinic with therapuetic INR.  He does not have a primary care physician.    He complains today of RUQ pain. Sometimes with movement, always with palpation.  He says that it has been there since his VATS, He does state is has become worse.  He denies bleeding, hemoptysis, or nausea associated with this. Otherwise is without complaint.  Current Medications (verified): 1)  Coumadin 5 Mg Tabs (Warfarin Sodium) .... Take 1 Tablet By Mouth Once A Day As Directed By Coumdain Clinic  Allergies (verified): No Known Drug Allergies  Review of Systems       RUQ pain  All other systems have been reviewed and are negative unless stated above.   Vital Signs:  Patient profile:   40 year old male Height:      72 inches Weight:      231 pounds BMI:     31.44 Pulse rate:   68 / minute BP sitting:   110 / 62  (right arm)  Vitals Entered By: Dreama Saa, CNA (Sep 14, 2009 3:55 PM)  Physical Exam  General:  Well developed, well nourished, in no acute distress. Lungs:  Clear bilaterally to auscultation and percussion. Heart:  Non-displaced PMI, chest non-tender; regular rate and rhythm, S1, S2 without murmurs, rubs or gallops. Carotid upstroke normal, no bruit. Normal abdominal aortic size, no bruits. Femorals normal pulses, no bruits. Pedals normal pulses. No edema, no varicosities. Abdomen:  Pain with palpation of the RUQ and into the midsection of the abdomen.  Very tender to touch. Normal bowel sounds. Msk:  Back normal, normal gait. Muscle strength and tone normal. Extremities:  No clubbing or  cyanosis. Neurologic:  Alert and oriented x 3.   EKG  Procedure date:  09/14/2009  Findings:      Normal sinus rhythm with rate of:    Impression & Recommendations:  Problem # 1:  ABDOMINAL PAIN, RIGHT UPPER QUADRANT (ICD-789.01) Uncertain etilogy of this pain.  Will order [place] of GB and liver to assess for abnormalities.  Liver enzymes will be drawn.  I have advised him to seek a primary care physican to continue to manage his lupus and any other medical issues. Orders: Ultrasound (Ultrasound)  Problem # 2:  PULMONARY EMBOLISM (ICD-415.19) He has been diagnosed with lupus anticoagulant and wil continue life long coumadin.  We will monitor. His updated medication list for this problem includes:    Coumadin 5 Mg Tabs (Warfarin sodium) .Marland Kitchen... Take 1 tablet by mouth once a day as directed by coumdain clinic  Other Orders: T-Comprehensive Metabolic Panel ([id number])  Patient Instructions: 1)  Your physician recommends that you schedule a follow-up appointment in: 12 months 2)  Your physician recommends that you return for lab work in: Today 3)  You have been referred to find a PMD (Gallbladder)

## 2010-06-05 NOTE — Medication Information (Signed)
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 no    Recent/future dental: no  Any missed doses?: no       Is patient compliant with meds? yes       Allergies: No Known Drug Allergies  Anticoagulation Management History:      The patient is taking warfarin and comes in today for a routine follow up visit.  Negative risk factors for bleeding include an age less than 17 years old.  The bleeding index is 'low risk'.  Negative CHADS2 values include Age > 82 years old.  The start date was 03/08/2005.  Anticoagulation responsible provider: Diona Browner MD, Remi Deter.  INR POC: 2.2.  Cuvette Lot#: [id number].  Exp: 10/11.    Anticoagulation Management Assessment/Plan:      The patient's current anticoagulation dose is Coumadin 5 mg tabs: Take 1 tablet by mouth once a day as directed by coumdain clinic.  The target INR is 2 - 3.  The next INR is due 04/19/2010.  Anticoagulation instructions were given to patient.  Results were reviewed/authorized by Vashti Hey, RN.  He was notified by Vashti Hey RN.         Prior Anticoagulation Instructions: INR 2.0 Increase coumadin to 5mg  once daily   Current Anticoagulation Instructions: INR 2.2 Continue coumadin 5mg  once daily

## 2010-06-07 NOTE — Medication Information (Signed)
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 no    Recent/future dental: no  Any missed doses?: no       Is patient compliant with meds? yes       Allergies: No Known Drug Allergies  Anticoagulation Management History:      The patient is taking warfarin and comes in today for a routine follow up visit.  Negative risk factors for bleeding include an age less than 21 years old.  The bleeding index is 'low risk'.  Negative CHADS2 values include Age > 44 years old.  The start date was 03/08/2005.  Anticoagulation responsible provider: Dietrich Pates MD, Molly Maduro.  INR POC: 2.0.  Exp: 04/2011.    Anticoagulation Management Assessment/Plan:      The patient's current anticoagulation dose is Coumadin 5 mg tabs: Take 1 tablet by mouth once a day as directed by coumdain clinic.  The target INR is 2 - 3.  The next INR is due 05/31/2010.  Anticoagulation instructions were given to patient.  Results were reviewed/authorized by Vashti Hey, RN.  He was notified by Vashti Hey RN.         Prior Anticoagulation Instructions: INR 1.8 TODAY INCREASE DOSE TO 1.5 TABLETS ON THURSDAYS AND TUESDAYS AND 1 TABLET ALL OTHER DAYS  Current Anticoagulation Instructions: INR 2.0 Take coumadin 2 tablets tonight then resume 1 tablet once daily except 1 1/2 tablets on Tuesdays and Thursdays

## 2010-06-07 NOTE — Medication Information (Signed)
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 no    Recent/future dental: no  Any missed doses?: no       Is patient compliant with meds? yes       Allergies: No Known Drug Allergies  Anticoagulation Management History:      The patient is taking warfarin and comes in today for a routine follow up visit.  Negative risk factors for bleeding include an age less than 82 years old.  The bleeding index is 'low risk'.  Negative CHADS2 values include Age > 75 years old.  The start date was 03/08/2005.  Anticoagulation responsible provider: Dietrich Pates MD, Molly Maduro.  INR POC: 2.3.  Cuvette Lot#: [id number].  Exp: 04/2011.    Anticoagulation Management Assessment/Plan:      The patient's current anticoagulation dose is Coumadin 5 mg tabs: 1 tablet once daily except 1 1/2 tablets on Tuesdays and Thursdays  or as directed by anticoagulation clinic.  The target INR is 2 - 3.  The next INR is due 07/02/2010.  Anticoagulation instructions were given to patient.  Results were reviewed/authorized by Vashti Hey, RN.  He was notified by Vashti Hey RN.         Prior Anticoagulation Instructions: INR 2.0 Take coumadin 2 tablets tonight then resume 1 tablet once daily except 1 1/2 tablets on Tuesdays and Thursdays   Current Anticoagulation Instructions: INR 2.3 Continue coumadin 5mg  once daily except 7.5mg  on T/Th

## 2010-06-07 NOTE — Medication Information (Signed)
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 no    Recent/future dental: no  Any missed doses?: yes     Details: 1 dose  Is patient compliant with meds? yes       Allergies: No Known Drug Allergies  Anticoagulation Management History:      The patient is taking warfarin and comes in today for a routine follow up visit.  Negative risk factors for bleeding include an age less than 36 years old.  The bleeding index is 'low risk'.  Negative CHADS2 values include Age > 79 years old.  The start date was 03/08/2005.  Anticoagulation responsible Donnisha Besecker: Diona Browner MD, Remi Deter.  INR POC: 1.8.  Cuvette Lot#: [id number].  Exp: 04/2011.    Anticoagulation Management Assessment/Plan:      The patient's current anticoagulation dose is Coumadin 5 mg tabs: Take 1 tablet by mouth once a day as directed by coumdain clinic.  The target INR is 2 - 3.  The next INR is due 05/03/2010.  Anticoagulation instructions were given to patient.  Results were reviewed/authorized by Teressa Lower, RN.  He was notified by Teressa Lower RN.         Prior Anticoagulation Instructions: INR 2.2 Continue coumadin 5mg  once daily   Current Anticoagulation Instructions: INR 1.8 TODAY INCREASE DOSE TO 1.5 TABLETS ON THURSDAYS AND TUESDAYS AND 1 TABLET ALL OTHER DAYS

## 2010-06-07 NOTE — Miscellaneous (Signed)
Summary: warfarin refill  Clinical Lists Changes  Medications: Changed medication from COUMADIN 5 MG TABS (WARFARIN SODIUM) Take 1 tablet by mouth once a day as directed by coumdain clinic to COUMADIN 5 MG TABS (WARFARIN SODIUM) 1 tablet once daily except 1 1/2 tablets on Tuesdays and Thursdays  or as directed by anticoagulation clinic - Signed Rx of COUMADIN 5 MG TABS (WARFARIN SODIUM) 1 tablet once daily except 1 1/2 tablets on Tuesdays and Thursdays  or as directed by anticoagulation clinic;  #45 x 2;  Signed;  Entered by: Teressa Lower RN;  Authorized by: Kathlen Brunswick, MD, [facility];  Method used: Electronically to [facility] [address], [place], [place], [place]  [address], Ph: [phone], Fax: [phone]    Prescriptions: COUMADIN 5 MG TABS (WARFARIN SODIUM) 1 tablet once daily except 1 1/2 tablets on Tuesdays and Thursdays  or as directed by anticoagulation clinic  #45 x 2   Entered by:   Teressa Lower RN   Authorized by:   Kathlen Brunswick, MD, [facility]   Signed by:   Teressa Lower RN on 05/31/2010   Method used:   Electronically to        [facility]  [address] (retail)       [address]       [place], [place]  [address]       Ph: [phone]       Fax: [phone]   RxID:   [id number]

## 2010-07-02 ENCOUNTER — Encounter (INDEPENDENT_AMBULATORY_CARE_PROVIDER_SITE_OTHER): Payer: Self-pay

## 2010-07-02 ENCOUNTER — Encounter: Payer: Self-pay | Admitting: Cardiology

## 2010-07-02 DIAGNOSIS — I80299 Phlebitis and thrombophlebitis of other deep vessels of unspecified lower extremity: Secondary | ICD-10-CM

## 2010-07-02 DIAGNOSIS — I2699 Other pulmonary embolism without acute cor pulmonale: Secondary | ICD-10-CM

## 2010-07-02 DIAGNOSIS — Z7901 Long term (current) use of anticoagulants: Secondary | ICD-10-CM

## 2010-07-02 LAB — CONVERTED CEMR LAB: POC INR: 2.2

## 2010-07-12 NOTE — Medication Information (Signed)
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 no    Recent/future dental: no  Any missed doses?: no       Is patient compliant with meds? yes       Allergies: No Known Drug Allergies  Anticoagulation Management History:      The patient is taking warfarin and comes in today for a routine follow up visit.  Negative risk factors for bleeding include an age less than 56 years old.  The bleeding index is 'low risk'.  Negative CHADS2 values include Age > 51 years old.  The start date was 03/08/2005.  Anticoagulation responsible provider: Diona Browner MD, Remi Deter.  INR POC: 2.2.  Cuvette Lot#: [id number].  Exp: 04/2011.    Anticoagulation Management Assessment/Plan:      The patient's current anticoagulation dose is Coumadin 5 mg tabs: 1 tablet once daily except 1 1/2 tablets on Tuesdays and Thursdays  or as directed by anticoagulation clinic.  The target INR is 2 - 3.  The next INR is due 07/30/2010.  Anticoagulation instructions were given to patient.  Results were reviewed/authorized by Vashti Hey, RN.  He was notified by Vashti Hey RN.         Prior Anticoagulation Instructions: INR 2.3 Continue coumadin 5mg  once daily except 7.5mg  on T/Th  Current Anticoagulation Instructions: INR 2.2 Continue coumadin 5mg  once daily except 7.5mg  on Tuesdays and Thursdays

## 2010-07-27 ENCOUNTER — Encounter: Payer: Self-pay | Admitting: Cardiology

## 2010-07-27 DIAGNOSIS — D6862 Lupus anticoagulant syndrome: Secondary | ICD-10-CM

## 2010-07-27 DIAGNOSIS — I2699 Other pulmonary embolism without acute cor pulmonale: Secondary | ICD-10-CM

## 2010-07-27 DIAGNOSIS — I743 Embolism and thrombosis of arteries of the lower extremities: Secondary | ICD-10-CM

## 2010-07-27 DIAGNOSIS — Z7901 Long term (current) use of anticoagulants: Secondary | ICD-10-CM

## 2010-07-30 ENCOUNTER — Encounter: Payer: Self-pay | Admitting: *Deleted

## 2010-08-01 ENCOUNTER — Ambulatory Visit (INDEPENDENT_AMBULATORY_CARE_PROVIDER_SITE_OTHER): Payer: Self-pay | Admitting: *Deleted

## 2010-08-01 DIAGNOSIS — I2699 Other pulmonary embolism without acute cor pulmonale: Secondary | ICD-10-CM

## 2010-08-01 DIAGNOSIS — Z7901 Long term (current) use of anticoagulants: Secondary | ICD-10-CM

## 2010-08-01 DIAGNOSIS — D6859 Other primary thrombophilia: Secondary | ICD-10-CM

## 2010-08-01 DIAGNOSIS — D6862 Lupus anticoagulant syndrome: Secondary | ICD-10-CM

## 2010-08-01 DIAGNOSIS — I743 Embolism and thrombosis of arteries of the lower extremities: Secondary | ICD-10-CM

## 2010-08-01 LAB — POCT INR: INR: 2.1

## 2010-08-29 ENCOUNTER — Ambulatory Visit (INDEPENDENT_AMBULATORY_CARE_PROVIDER_SITE_OTHER): Payer: Self-pay | Admitting: *Deleted

## 2010-08-29 DIAGNOSIS — Z7901 Long term (current) use of anticoagulants: Secondary | ICD-10-CM

## 2010-08-29 DIAGNOSIS — D6859 Other primary thrombophilia: Secondary | ICD-10-CM

## 2010-08-29 DIAGNOSIS — D6862 Lupus anticoagulant syndrome: Secondary | ICD-10-CM

## 2010-08-29 DIAGNOSIS — I2699 Other pulmonary embolism without acute cor pulmonale: Secondary | ICD-10-CM

## 2010-08-29 DIAGNOSIS — I743 Embolism and thrombosis of arteries of the lower extremities: Secondary | ICD-10-CM

## 2010-08-29 LAB — POCT INR: INR: 2.3

## 2010-09-18 NOTE — Letter (Signed)
Sep 17, 2007    Mr. [facility]  [address]  [place], [place] [address]   RE:  PETER, KEYWORTH  MRN:  [id number]  /  DOB:  07/02/1970   Dear Feliciana Rossetti:   Mr. Glenwood Revoir. [place] is being dismissed from the [facility]  practice.   The patient failed to show up for his office appointment on April 28th.  This was his fourth no-show.  Given the fact that the patient is not  responsible for timely follow-up with our group, he is now being  dismissed from the practice.  The patient has 30 days to find another  physician.  During that 30 days we would be able to care for the patient  only in an emergent situation.  Medical records will be available to any  other caregiver who assumes his care.  The patient may also be dismissed  from other divisions within the [facility] medical facilities.   A certified letter will be sent to the last known address of the patient  or responsible party.  A copy of that letter is on file in our medical  record.    Sincerely,      Noralyn Pick. Eden Emms, MD, [facility]  Electronically Signed    PCN/MedQ  DD: 09/17/2007  DT: 09/17/2007  Job #: [id number]

## 2010-09-21 NOTE — H&P (Signed)
Andrew [name], Andrew [name]               ACCOUNT NO.:  [id number]   MEDICAL RECORD NO.:  [id number]          PATIENT TYPE:  INP   LOCATION:  2113                         FACILITY:  [facility]   PHYSICIAN:  Shan Levans, M.D. LHCDATE OF BIRTH:  [birth year]   DATE OF ADMISSION:  02/25/2005  DATE OF DISCHARGE:                                HISTORY & PHYSICAL   CHIEF COMPLAINT:  Respiratory distress, pleural effusion, pulmonary  embolism.   HISTORY OF PRESENT ILLNESS:  A 40 year old white male admitted on February 20, 2005 to [facility] with bilateral pulmonary embolism and right lower  lobe infarctions, small right pleural effusion.  He has not improved,  despite receiving appropriate therapy with IV heparin, and then later  Lovenox therapeutically for the pulmonary emboli.  He has now developed a  complex right pleural effusion that is not able to be drained with  ultrasound-guided thoracentesis, and he is transferred to [facility] for  further evaluation.   PAST HISTORY:   MEDICAL:  Essentially negative.   PAST SURGERY:  No previous history of operative interventions.   MEDICAL ILLNESSES:  No history of medical illnesses.   MEDICATIONS:  No previous medications.   ALLERGIES:  None.   SOCIAL HISTORY:  He does smoke a pack a day.  He works as a Education administrator.  He is  married.  No clotting disorder problems in the past.   REVIEW OF SYSTEMS:  Otherwise noncontributory.   PHYSICAL EXAMINATION:  GENERAL:  This is an ill-appearing, uncomfortable  white male in mild respiratory distress.  CHEST:  Decreased breath sounds and dull to percussion three-quarters of the  way up on the right.  CARDIAC:  Regular rate and rhythm without S3.  Normal S1 and S2.  No murmur,  rub, heave, or gallop.  ABDOMEN:  Soft, nontender.  Bowel sounds hypoactive.  EXTREMITIES:  No clubbing, edema, or venous disease.  NEUROLOGIC:  Intact.   LABORATORY DATA:  Hemoglobin was 12.6, white count 12,400, platelet count  336,000.  INR of 1.2.  PTT 37.  Sodium 130, potassium 3.3, chloride 95, CO2  of 27, BUN 14, creatinine 0.9, blood sugar 106.  Liver functions  satisfactory.  Hepatitis profile negative.  Urinalysis negative.  Anticardiolipin antibody negative.   CT of the chest shows a large loculated right pleural effusion with near  total collapse of the right lung and bilateral lower lobe pulmonary emboli  and right lower lobe infarction and probable pneumonia.   IMPRESSION:  Acute pulmonary embolism with loculated right pleural effusion,  right lower lobe infarction, and possible pneumonia.   RECOMMENDATIONS:  Give IV Avelox.  Obtain CVTS consult with Dr. Karle Plumber.  He has been called for chest tube placement plus/minus VATS.  Administer IV heparin until procedures are complete.  No Coumadin until  procedures are complete.  Transfer to the ICU for closer monitoring and pain  control with IV Dilaudid.      Shan Levans, M.D. [facility]  Electronically Signed     PW/MEDQ  D:  02/25/2005  T:  02/25/2005  Job:  [id number]   cc:  Edward L. Juanetta Gosling, M.D.  Fax: [phone]   Margaretmary Dys, M.D.   Leslye Peer, M.D.   Ines Bloomer, M.D.  [address]  [place]  [place] [address]

## 2010-09-21 NOTE — Discharge Summary (Signed)
Andrew [name], Andrew [name]               ACCOUNT NO.:  [id number]   MEDICAL RECORD NO.:  [id number]          PATIENT TYPE:  INP   LOCATION:  2004                         FACILITY:  [facility]   PHYSICIAN:  Leslye Peer, M.D.  DATE OF BIRTH:  [birth year]   DATE OF ADMISSION:  02/25/2005  DATE OF DISCHARGE:  03/08/2005                                 DISCHARGE SUMMARY   PRINCIPAL DISCHARGE DIAGNOSIS:  Complicated right pleural effusion, right  hemothorax versus empyema (culture negative).   SECONDARY DISCHARGE DIAGNOSES:  1.  Bilateral lower extremity deep venous thrombi.  2.  Probable pulmonary embolism.  3.  Lupus anticoagulant positive.  4.  Tobacco abuse, stopped 16 days ago.   DISCHARGE DIET:  To include a stable amount of green vegetables.   DISCHARGE ACTIVITY:  Without restrictions.  He was instructed to increase  his activities slowly to ensure that he tolerates increases in work.   WOUND CARE:  Not applicable.   PROCEDURES PERFORMED:  1.  CT pulmonary angiogram on February 20, 2005 at [facility] showed      a possible pulmonary embolism and a small right-side effusion.  2.  CT scan of the chest on February 24, 2005 at [facility] showed a      large loculated right effusion with associated right impressive      atelectasis.  3.  Status post VATS right decortication by Dr. Karle Plumber on February 28, 2005 at [facility].  4.  Doppler ultrasound of the lower extremities on March 05, 2005 showed      bilateral lower extremity DVTs.  5.  CT pulmonary angiogram on March 05, 2005 showed no evidence of      pulmonary embolism and significant improvement in his right pleural      space with a small amount of residual hydropneumothorax.   BRIEF HOSPITAL COURSE:  Andrew [name] is a 40 year old gentleman with a history  of significant tobacco abuse but little other past medical history who was  first admitted to [facility] on February 20, 2005 with  pleuritic  right sided chest pain.  He had a CT pulmonary angiogram that was  questionable for pulmonary embolism with an associated right effusion.  He  was treated with anticoagulation and a work up for hypocoagulable state was  initiated.  This revealed that he is lupus anticoagulant positive.  He  unfortunately experienced worsening dyspnea, fever and tachypnea during his  hospitalization there and a repeat CT scan of the chest showed that he had a  large loculated pleural effusion consistent with either hemothorax or  empyema.  He was therefore transferred from [facility] to [facility] on February 25, 2005.  Review of his CT scans of the chest  showed possible evidence of pulmonary embolism but it was uncertain whether  this was truly his underlying diagnosis, therefore his anticoagulation was  discontinued.  He was referred to Dr. Karle Plumber with cardiovascular  thoracic surgery for a VATS decortication of his right hemothorax.  This was  performed on February 28, 2005 and Andrew [name] tolerated the procedure well.  He was treated with broad spectrum antibiotics which were converted to p.o.  Augmentin and remained afebrile.  Culture data from both tissue biopsy and  from pleural fluid was negative.  His chest tubes were removed and after  clearance of his effusion atelectasis a repeat CT pulmonary angiogram was  performed on March 05, 2005.  This showed no evidence of pulmonary  embolism and improved right pleural space with a small amount of residual  hydropneumothorax.  Doppler ultrasounds of the lower extremity veins were  then performed on March 05, 2005.  These showed bilateral lower extremity  DVTs.  His anticoagulation was re initiated, first with unfractionated  Heparin and then with low molecular weight Heparin.  He tolerated  anticoagulation without any recurrence of his right-sided effusion or drop  in his hemoglobin.  He was deemed stable and  appropriate for discharge on  March 08, 2005 with plans to continue Enoxaparin as an outpatient until he  becomes therapeutic on Coumadin.  He will need Coumadin for 6 months.  [name] will arrange for local medical followup in [place], [place].  He will follow up with Dr. Delton Coombes in [place] until this has been  accomplished.   DISCHARGE MEDICATIONS:  1.  Enoxaparin 90 mg subcutaneous b.i.d.  2.  Coumadin 5 mg p.o. daily.  3.  Tylenol 650 mg p.o. q.8h p.r.n. for pain.   FOLLOW UP INSTRUCTIONS:  1.  Andrew [name] will have a PT and INR drawn on March 11, 2005 by a home      health nurse and results will be called to Dr. Delton Coombes at [phone].  2.  The patient will follow up with Dr. Delton Coombes at [facility], phone      number [phone], on November 9 at 2:50 p.m.  3.  The patient will have a chest x-ray performed on March 20, 2005 at      8:20 a.m. at [facility] and then will see Dr. Karle Plumber at      cardiovascular thoracic surgeons, phone number [phone], on November 15      at 9:20 a.m.           ______________________________  Leslye Peer, M.D.     RSB/MEDQ  D:  03/08/2005  T:  03/08/2005  Job:  [id number]   cc:   Osvaldo Shipper, MD   Oneal Deputy. Juanetta Gosling, M.D.  Fax: [phone]

## 2010-09-21 NOTE — Group Therapy Note (Signed)
NAMETULIO, FACUNDO               ACCOUNT NO.:  [id number]   MEDICAL RECORD NO.:  [id number]          PATIENT TYPE:  INP   LOCATION:  A206                          FACILITY:  [facility]   PHYSICIAN:  Edward L. Juanetta Gosling, M.D.DATE OF BIRTH:  1971/01/12   DATE OF PROCEDURE:  02/24/2005  DATE OF DISCHARGE:                                   PROGRESS NOTE   SUBJECTIVE:  Mr. Urbas says he is feeling better.  He is able to take deeper  breaths and he is not as sore and congested.   OBJECTIVE:  GENERAL:  His physical exam shows that he does indeed look  better.  VITAL SIGNS:  His temperature is 99.3, pulse 106, respirations 20, blood  pressure 140/73, O2 saturations 92%.  CHEST:  His chest is clearer.  He still has some decreased breath sounds,  but overall is better.   ASSESSMENT:  He is overall better.   PLAN:  Continue his treatments and follow up.      Edward L. Juanetta Gosling, M.D.  Electronically Signed     ELH/MEDQ  D:  02/24/2005  T:  02/25/2005  Job:  [id number]

## 2010-09-21 NOTE — Discharge Summary (Signed)
Andrew [name], Andrew [name]               ACCOUNT NO.:  [id number]   MEDICAL RECORD NO.:  [id number]          PATIENT TYPE:  INP   LOCATION:  A206                          FACILITY:  [facility]   PHYSICIAN:  Osvaldo Shipper, MD     DATE OF BIRTH:  03-08-1971   DATE OF ADMISSION:  02/20/2005  DATE OF DISCHARGE:  LH                                 DISCHARGE SUMMARY   Please note that the patient is being transferred to [facility].   DISCHARGE/TRANSFER DIAGNOSES:  1.  Pulmonary embolism, complicated by loculated pleural effusion(s).  2.  Lupus anticoagulant-positive.   PHYSICAL EXAMINATION/HISTORY:  Please see H&P dictated at the time of  admission for details regarding the patient's history of present illness.   BRIEF HOSPITAL COURSE:  1.  Acute pulmonary embolus.  The patient is a 40 year old Caucasian male      with no significant past medical history, who presented to the ED with      an acute-onset right chest wall pain.  The pain was described as      pleuritic pain.  The patient underwent a D-dimer after it was found that      the patient was markedly hypoxic.  A D-dimer was found to be 1.10.      Subsequent to this, the patient underwent a CAT scan of the chest.  A      CAT scan revealed right-sided pulmonary embolism, with possible right      basilar atelectasis.  The patient was subsequently admitted to the      hospital and started on IV heparin.  IV heparin was changed over to      Lovenox the following day.  The patient did not have any risk factors      for PE.  There is no family history.  There is no previous history of      such episodes in the patient.  Hence, partial hypercoagulable workup was      initiated in this patient.   On my evaluation of the patient yesterday, the patient was still  considerably hypoxic and tachypneic.  He was also febrile with temperatures  about 101.4.  A stat chest x-ray was ordered yesterday that showed a white-  out of the right side.   His initial x-ray did not show such findings.  Based  on this new development, a CAT scan of the chest was again ordered, that  showed possible loculated pleural effusion on the right side.  Because the  patient was on anticoagulation, the concern was about hemothorax.  Hence,  his anticoagulation was stopped and ultrasound-guided thoracentesis was  ordered for today.  However, since the fluid was too complex, probably too  thick, no fluid could be aspirated during the procedure.  Since the patient  is running low-grade temperatures and since he is still symptomatic, I  believe that this collection cannot be treated by antibiotics and  conservative management alone.  I discussed the case subsequently with Dr.  Delton Coombes, at [facility], who is a pulmonologist, and he agreed that  the patient  possibly needs drainage and evaluation of this collection.  Since there is no CTS coverage here at [facility], Dr. Delton Coombes  graciously accepted the patient for transfer.   In the interim, based on the impressions of radiologist and the fact that no  fluid could be removed, it is unlikely that this is blood and hence  anticoagulation will be reinitiated in this patient in the form of  unfractionated heparin.   1.  As a part of this hypercoagulable workup, we checked the patient's      protein C, protein S, and both of them seemed within the normal range.      Antithrombin III was also checked which also appeared to be within the      normal range.  Lupus anticoagulant was detected in the patient.      Subsequently, ANA and DS-DNA were done which were negative.  ESR and CRP      were also significantly elevated.  I believe that help from      rheumatologist/hematologist is needed to further sort out these issues.   DISCHARGE MEDICATIONS:  1.  IV heparin for acute PE.  2.  Levofloxacin 750 mg IV q.24h.  3.  Protonix 40 mg p.o. daily.  4.  Patient's Lovenox and coumadin discontinued for  now.  5.  Pain control, he is requiring morphine 1-2 mg every 4 hours IV.   FOLLOWUP:  To be determined upon discharge from [facility].      Osvaldo Shipper, MD  Electronically Signed     GK/MEDQ  D:  02/25/2005  T:  02/25/2005  Job:  [id number]   cc:   Leslye Peer, M.D.   Edward L. Juanetta Gosling, M.D.  Fax: [phone]

## 2010-09-21 NOTE — Op Note (Signed)
Andrew [name], Andrew [name]               ACCOUNT NO.:  [id number]   MEDICAL RECORD NO.:  [id number]          PATIENT TYPE:  INP   LOCATION:  2550                         FACILITY:  [facility]   PHYSICIAN:  Ines Bloomer, M.D. DATE OF BIRTH:  1970-06-19   DATE OF PROCEDURE:  02/28/2005  DATE OF DISCHARGE:                                 OPERATIVE REPORT   HISTORY:  This patient was transferred down from [facility] with  opacification in the right chest.  He had been admitted with right chest  pain and was thought to have a right pulmonary embolus and was immediately  started on heparin and had opacification of his right chest.  Because of his  continued deterioration he was transferred down for treatment.  A chest tube  was inserted and only got out old blood.  His heparin was stopped because we  did not feel that he had a pulmonary embolus and he was brought to the  operating room for decortication and drainage.   DESCRIPTION OF PROCEDURE:  After general anesthesia he was turned in the  right lateral thoracotomy position and a dual __________ tube was inserted.  He was prepped and draped in the usual sterile manner.  A trocar site was  made in the old chest tube side and at that anterior-axillary line at the  seventh intercostal space and then at the posterior-axillary line at the  seventh intercostal space the 0-degree scope was inserted and this came over  the diaphragm and we inserted the scope and it was obvious that this patient  did have some bleeding, so he did have a hemothorax, that was suspected, but  he also had an empyema.  He probably had an empyema and secondary bleeding.  This was partially removed through the two trocar sites using Kyzer ring and  forceps to try to free up the lung, anteriorly, posteriorly, and superiorly,  but required an access, a small thoracotomy incision in order to complete  the procedure.  Over the sixth intercostal space from the anterior  axillary  line to the mid axillary line the latissimus was partially divided, the  edges were split and the sixth intercostal space was entered.  On entering  the space a large amount of inflammatory exudate was removed from the major  fissure and sent for culture and this was debrided out of the major fissure.  It had large, kind of pseudoloculations in the major fissure.  Then the lung  was freed up laterally.  We had to remove a thickened flora peel laterally  from the diaphragm up t the apex and partially medially and then the lung  was freed up at the apex and then medially, and finally off the diaphragm.  All exudate was removed.  There was a lot of exudate in the right  costophrenic angle.   Attention was then turned to the lung and decortication was carried out of  the upper lobe removing the thickened exudate off the upper lobe, then the  middle lobe, in like manner using Kyzer ring forceps, Russians and DeBakey  clamps  as well as Kitner's to remove this; and finally the lower lobe which  was very thickened, to remove it.  The lung was reexpanded.  Several leaks  in the lung were sutured with 3-0 silk.  Three chest tubes were brought in,  Two straight chest tubes through the trocar sites and then one in the middle  was used as a right-angle, 36 chest tube placed in the right costophrenic  angle.  The other two chest tubes were 36 chest tubes.  The __________  catheter was placed above the ribs rather than subpleurally because the  pleura had been removed from the third to the seventh ribs and secured in  place with Steri-Strips.  Marcaine block was done in the usual fashion.  Chest was closed with four pericostals, #1 Vicryl in the muscle layer, 2-0  Vicryl in the subcutaneous tissue and Ethicon and skin clips.  The patient  returned to the recovery room in stable condition.           ______________________________  Ines Bloomer, M.D.    DPB/MEDQ  D:  02/28/2005  T:   03/01/2005  Job:  [id number]

## 2010-09-21 NOTE — Group Therapy Note (Signed)
[name], [name]               ACCOUNT NO.:  [id number]   MEDICAL RECORD NO.:  [id number]          PATIENT TYPE:  INP   LOCATION:  A206                          FACILITY:  [facility]   PHYSICIAN:  Edward L. Juanetta Gosling, M.D.DATE OF BIRTH:  06-Nov-1970   DATE OF PROCEDURE:  02/23/2005  DATE OF DISCHARGE:                                   PROGRESS NOTE   PROBLEM:  Pulmonary embolus.   SUBJECTIVE:  Andrew [name] says he feels a little better. He is coughing some  but not coughing up any blood.   OBJECTIVE:  VITAL SIGNS:  His exam shows that his temperature is 99.2, pulse  108, respirations 24, blood pressure 139/75, O2 saturation 94% on 3 liters.  CHEST:  Relatively clear.  HEART:  Regular.  ABDOMEN:  Soft.   ASSESSMENT:  He seems to be doing better.   PLAN:  Plan is to continue his current medications and treatments. I have  encouraged him to cough and deep breath.      Edward L. Juanetta Gosling, M.D.  Electronically Signed     ELH/MEDQ  D:  02/23/2005  T:  02/24/2005  Job:  [id number]

## 2010-09-21 NOTE — Assessment & Plan Note (Signed)
[facility]                       [facility] OFFICE NOTE   NATURE, VOGELSANG                      MRN:          [id number]  DATE:05/08/2006                            DOB:          1970/06/15    REFERRING PHYSICIAN:  None   Mr. Coffin returns to the office for continuing treatment of a history of  pulmonary embolism associated with the lupus anticoagulant.  He has done  well since he was last seen 9 months ago.  He did experience an episode  of chest discomfort for which he was evaluated in the emergency  department.  A CT scan of the chest showed no evidence for pulmonary  embolism.  There were postoperative changes at the right lung base.  Previously noted mediastinal adenopathy was essentially resolved.  There  were fatty changes in the liver.   Mr. Pen has remained on therapeutic anticoagulation without adverse  effects.  He has returned to normal activity.  He continues to smoke  cigarettes at the reduced rate of 1 pack per week.  His only medication  is Warfarin.   EXAM:  Pleasant, young gentleman in no acute distress.  Weight is 245, ten pounds less than in March 2007.  Blood pressure  120/75.  Heart rate 70 and regular.  Respirations 16.  NECK:  No jugular venous distention; normal carotid upstrokes without  bruits.  LUNGS:  Decreased breath sounds and a few rales at the right base.  CARDIAC:  Normal first and second heart sounds.  EXTREMITIES:  Normal distal pulses; no edema.   EKG:  Normal sinus rhythm; right ventricular conduction delay; leftward  axis.   The patient's prior records were reviewed.  His CT scan reports and  chest x-ray reports were appended to his outpatient chart.  He had a  hypercoagulable profile in October 2006.  PTT was elevated and did not  correct with mixing.  The lupus anticoagulant test was positive, but  anticardiolipin antibodies were negative.  The remainder of the tests  including  prothrombin-2 variant, factor V Leiden, protein-C, protein-S,  and anti-thrombin-3 were all negative.   Mr. Voland has a history of pulmonary embolism with a lupus  anticoagulant.  I believe that he should be anticoagulated indefinitely  and will continue to monitor his therapy.  A CBC and stool for hemoccult  testing are pending.     Gerrit Friends. Dietrich Pates, MD, [facility]  Electronically Signed    RMR/MedQ  DD: 05/08/2006  DT: 05/08/2006  Job #: [id number]

## 2010-09-21 NOTE — Group Therapy Note (Signed)
NAMEBARAA, TUBBS               ACCOUNT NO.:  [id number]   MEDICAL RECORD NO.:  [id number]          PATIENT TYPE:  INP   LOCATION:  A206                          FACILITY:  [facility]   PHYSICIAN:  Vania Rea, M.D. DATE OF BIRTH:  Jan 23, 1971   DATE OF PROCEDURE:  02/22/2005  DATE OF DISCHARGE:                                   PROGRESS NOTE   HOSPITAL DAY #2.   SUBJECTIVE:  The patient says he is having severe right-sided chest pain,  difficulty breathing, and difficulty moving.   OBJECTIVE:  The patient is lying flat in bed, very tachypneic, and seems to  be in obvious painful distress, receiving heparin drip.  Currently, the  patient denies any risk factors for thromboembolism including long-distance  driving, impaired mobility, steroid drug use, or family history of clots.  VITAL SIGNS:  Temperature 98.6.  Pulse 84.  Respirations 20, although he  seems to be breathing faster than this.  His blood pressure is 113/68.  He  is saturating at 93% on 2 liters.  CHEST:  His breathing is very shallow.  There are crackles.  ABDOMEN:  Tender in the right flank.  EXTREMITIES:  Without edema.   LABORATORY DATA:  His white count remains elevated at 16.6, hemoglobin  stable at 13.8, platelets stable at 278.  His absolute neutrophil count has  gone up a little to 13.6.  PT is 14, INR 1.1.  The heparin level is a bit on  the low side this morning; it is only 0.1.  Sodium is 138, potassium is 3.8,  chloride 99, CO2 25.  Glucose is elevated at 142, BUN 12, creatinine 1.0.  His calcium is 8.9.  His liver function tests yesterday were normal.  His  iron and ferritin levels were normal.  His homocysteine and hepatitis panel  were all normal.   ASSESSMENT:  Acute pulmonary embolus in a young man with no obvious risk  factors.   Will get an ABG on room air and will consult pulmonary for assistance if he  remains significantly hypoxic.  Will probably transition to Lovenox for  better  control.      Vania Rea, M.D.  Electronically Signed     LC/MEDQ  D:  02/22/2005  T:  02/22/2005  Job:  [id number]

## 2010-09-21 NOTE — H&P (Signed)
[name], [name]               ACCOUNT NO.:  [id number]   MEDICAL RECORD NO.:  [id number]          PATIENT TYPE:  INP   LOCATION:  A206                          FACILITY:  [facility]   PHYSICIAN:  Margaretmary Dys, M.D.DATE OF BIRTH:  08/09/1970   DATE OF ADMISSION:  02/21/2005  DATE OF DISCHARGE:  LH                                HISTORY & PHYSICAL   PRIMARY CARE PHYSICIAN:  The patient is unassigned.   ADMISSION DIAGNOSES:  1.  Right pulmonary embolism.  2.  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   CHIEF COMPLAINT:  Shortness of breath and chest pain of several hours'  duration.   HISTORY OF PRESENT ILLNESS:  [name] is a 40 year old Caucasian male with  no significant past medical history who presents to the emergency room with  acute onset of right chest wall pain.  He describes the pain as a pleuritic  chest pain.   He also had associated shortness of breath and diaphoresis.   On arrival in the emergency room, he was found to be hypoxic with oxygen  saturation of 87% and also to be in marked pain.  A  CT scan angiogram of  his chest was ordered to start which found the presence of a right pulmonary  embolus.   Heparin was subsequently started.   We initially thought also he could have possibility of kidney stones for  which he had a CT of his abdomen and pelvis with contrast which the  preliminary report indicated was unremarkable.   The patient continues to have pain and rates his pain as a 6/10 at the time  I was examining him.   The patient is being admitted for further evaluation of pulmonary embolus in  an otherwise healthy male.   REVIEW OF SYSTEMS:  Ten-point review of systems otherwise negative except as  mentioned in history of present illness.  The patient denies any leg  swelling.   PAST MEDICAL HISTORY:  None.   MEDICATIONS:  None.   ALLERGIES:  No known drug allergies.   FAMILY HISTORY:  Family history is negative for hypertension, diabetes,  coronary artery disease or deep venous thrombosis.   SOCIAL HISTORY:  The patient is single, but currently has a live-in  girlfriend who is pregnant.  He has a 50-year-old child.  He smokes about 1  pack a day, does not drink alcohol, denies IV drug abuse and marijuana use.  He has not had a recent trip of flying on a plane for any reasonable length  of time.   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  He did notice over the last  couple of weeks that he has had some flu-like symptoms, but that has not  stopped him from continuing to work.   PHYSICAL EXAMINATION:  GENERAL:  The patient was sedated, but well-oriented  to time, place and person.  VITAL SIGNS:  His blood pressure is 136/75, pulse of 114, respiratory rate  is 24, temperature 98.6.  Pain is 10/10.  Oxygen saturation was 87% on room  air; this improved to 94% on 2  L.  HEENT:  Normocephalic, atraumatic.  Oral mucosa was moist with no exudates.  NECK:  Supple.  No JVD or lymphadenopathy.  LUNGS:  Clear clinically, although he had reduced air entry bilaterally due  to poor effort and splinting.  HEART:  S1 and S2 regular.  No S3, S4, gallops or rubs.  ABDOMEN:  Soft and nontender.  Bowel sounds positive.  EXTREMITIES:  No swelling.  No erythema or edema were noted.  No clinical  evidence suggestive of deep venous thrombosis.  CNS:  The patient was sedated and has no focal neurologic deficits.   IMAGING STUDIES:  CT scan of the chest reveals right pulmonary embolism and  possible right basal atelectasis as a result of an infarct.   CT scan of his abdomen and pelvic was negative for any urinary tract  calculi.  There was a small right pleural effusion noted.  There was also  sludge within the gallbladder.  The pelvic CT was otherwise negative.   LABORATORY DATA:  White blood cell count is 15.9, hemoglobin was 13.6,  hematocrit 39.1, platelet count was 266,000; there was no left shift.   D-dimer was 1.10.  Sodium 137,  potassium 3.6, chloride of 106, CO2 was 25,  glucose 124, BUN is 12, creatinine is 1.0, calcium is 8.7.  B natriuretic  peptide was negative.  Urinalysis was negative.  Blood cultures are pending.   ASSESSMENT AND PLAN:  [name] is a 40 year old Caucasian male with no  significant past medical history, who presents to the emergency room with  acute onset of shortness of breath and right-sided chest pain which is  pleuritic.  The patient has a confirmed right pulmonary embolus on CT scan  of his chest.  He has already been started on heparin for anticoagulation.   The etiology of his pulmonary embolus is unclear at this time.  I do not see  any obvious risk factors.  I will do blood work for possible genetic  predisposition to deep venous thrombosis.  We will obtain blood for factor V  Leiden.  We will also obtain a protein C and protein S level on him.   I will start him on Coumadin after 24 hours of being on the heparin.   I have explained the above plan to the patient in detail, who verbalized  full understanding.  We will also check an ultrasound of his lower  extremities to evaluate for source of his clot.      Margaretmary Dys, M.D.  Electronically Signed     AM/MEDQ  D:  02/21/2005  T:  02/21/2005  Job:  [id number]

## 2010-09-21 NOTE — Consult Note (Signed)
Andrew [name], Andrew [name]               ACCOUNT NO.:  [id number]   MEDICAL RECORD NO.:  [id number]          PATIENT TYPE:  INP   LOCATION:  A206                          FACILITY:  [facility]   PHYSICIAN:  Edward L. Juanetta Gosling, M.D.DATE OF BIRTH:  July 26, 1970   DATE OF CONSULTATION:  02/22/2005  DATE OF DISCHARGE:                                   CONSULTATION   REASON FOR CONSULTATION:  Pulmonary embolism.  Consult for Dr. Orvan Falconer.   HISTORY OF PRESENT ILLNESS:  Andrew [name] is a 40 year old who has had  essentially no previous medical problems.  He came to the emergency room on  the day of admission with an acute onset of right sided chest pain.  He says  it is pleuritic in nature.  He has been coughing a little bit, and he has  been short of breath.  He has a little appetite, and he has been  diaphoretic. He has had a CT angiogram of the chest which showed a right  pulmonary embolus, and he has been on heparin, now on Lovenox.   PAST MEDICAL HISTORY:  Basically negative.   MEDICATIONS:  None.   ALLERGIES:  No known drug allergies.   FAMILY HISTORY:  No known history of heart disease, blood pressure problems,  diabetes and significantly no history of any clotting abnormalities.   SOCIAL HISTORY:  He lives with his girlfriend.  He smokes about a package of  cigarettes daily.  He works as a Education administrator.  He has not had any problems with  his legs.   REVIEW OF SYSTEMS:  Otherwise negative.   PHYSICAL EXAMINATION:  GENERAL APPEARANCE:  Awake and alert, complaining of  not feeling well.  VITAL SIGNS:  Temperature 98.6, pulse 84, respirations 20, blood pressure  113/68, O2 saturation 93% on 2 liters.  HEENT:  Mucous membranes moist.  Nose and throat are clear.  Pupils are  reactive.  NECK:  Supple without masses.  CHEST:  Fairly clear without wheezes, rales or rhonchi.  Chest shows some  rhonchi, more on the right than on the left.  HEART:  Regular.  ABDOMEN:  Soft.  EXTREMITIES:  No edema.  He  does not have any cords.   ASSESSMENT:  With no risk factors, I simply should go ahead and check laden  factor and protein S levels just to be sure that we are not missing some  other problem that might be causing his difficulties.  I agree with the use  of Lovenox, I think it is a better choice for pulmonary embolus, and would  fully anticoagulate him with Coumadin as  well.  Because he has a history of perhaps a pulmonary infarction, I think  we should go ahead and treat him with antibiotics as well, and I have  ordered that.   Thank you for allowing me to see him with you.      Edward L. Juanetta Gosling, M.D.  Electronically Signed     ELH/MEDQ  D:  02/23/2005  T:  02/24/2005  Job:  [id number]

## 2010-09-21 NOTE — Assessment & Plan Note (Signed)
[facility]                                 ON-CALL NOTE   Andrew [name], Andrew [name]                        MRN:          [id number]  DATE:07/08/2006                            DOB:          1971/04/24    PHONE NUMBER:  [phone].   CARDIOLOGIST:  Dr. Victoria Vera Bing.   HISTORY:  Mr. Bacha is a 40 year old male patient followed by Dr.  Dietrich Pates with a history of pulmonary embolism and lupus anticoagulant  who is on warfarin therapy who called the answering service tonight with  concerns over flulike symptoms. This has been ongoing for the last 3  days. He has had a temperature of 103. He has diffuse myalgias. He has a  mild nonproductive cough. He does have some sinus congestion as well. He  denies any chest pain or shortness of breath. He denies any syncope.   PLAN:  I explained to the patient that he needs to push fluids as much  as possible. He can also take Tylenol and/or Motrin to help bring his  fever down. He should be cautious with this as he is on Warfarin. He  does have an appointment tomorrow in the Coumadin clinic in [place].  He should apprise the Coumadin clinic of what he has taken for his fever  so that they can keep a close eye on his INRs. I have recommended that  he go to the emergency room or an urgent care center tonight should he  begin to feel worse. He has been instructed to obtain a primary care  physician. If he continues to feel well tonight and does not go to seek  care, he should seek care tomorrow at an urgent care center to further  treat his symptoms. Of note, he did not receive a flu vaccination this  year. It sounds as though he most likely has influenza.   DISPOSITION:  As noted above.      Tereso Newcomer, PA-C  Electronically Signed      Noralyn Pick. Eden Emms, MD, [facility]  Electronically Signed   SW/MedQ  DD: 07/08/2006  DT: 07/09/2006  Job #: [id number]

## 2010-09-26 ENCOUNTER — Encounter: Payer: Self-pay | Admitting: *Deleted

## 2010-10-04 ENCOUNTER — Ambulatory Visit (INDEPENDENT_AMBULATORY_CARE_PROVIDER_SITE_OTHER): Payer: Self-pay | Admitting: *Deleted

## 2010-10-04 DIAGNOSIS — D6859 Other primary thrombophilia: Secondary | ICD-10-CM

## 2010-10-04 DIAGNOSIS — I743 Embolism and thrombosis of arteries of the lower extremities: Secondary | ICD-10-CM

## 2010-10-04 DIAGNOSIS — I2699 Other pulmonary embolism without acute cor pulmonale: Secondary | ICD-10-CM

## 2010-10-04 DIAGNOSIS — Z7901 Long term (current) use of anticoagulants: Secondary | ICD-10-CM

## 2010-10-04 DIAGNOSIS — D6862 Lupus anticoagulant syndrome: Secondary | ICD-10-CM

## 2010-10-04 LAB — POCT INR: INR: 2.8

## 2010-10-05 ENCOUNTER — Other Ambulatory Visit: Payer: Self-pay | Admitting: *Deleted

## 2010-10-05 MED ORDER — WARFARIN SODIUM 5 MG PO TABS: ORAL_TABLET | ORAL | Status: DC

## 2010-10-31 ENCOUNTER — Emergency Department (HOSPITAL_COMMUNITY)
Admission: EM | Admit: 2010-10-31 | Discharge: 2010-10-31 | Disposition: A | Discharge status: Home / Self Care | Payer: Self-pay | Attending: Emergency Medicine | Admitting: Emergency Medicine

## 2010-10-31 ENCOUNTER — Emergency Department (HOSPITAL_COMMUNITY): Payer: Self-pay

## 2010-10-31 DIAGNOSIS — R42 Dizziness and giddiness: Secondary | ICD-10-CM | POA: Insufficient documentation

## 2010-10-31 DIAGNOSIS — Z86718 Personal history of other venous thrombosis and embolism: Secondary | ICD-10-CM | POA: Insufficient documentation

## 2010-10-31 LAB — DIFFERENTIAL
Basophils Absolute: 0 10*3/uL (ref 0.0–0.1)
Basophils Relative: 0 % (ref 0–1)
Eosinophils Absolute: 0.2 10*3/uL (ref 0.0–0.7)
Lymphs Abs: 1.6 10*3/uL (ref 0.7–4.0)
Monocytes Relative: 5 % (ref 3–12)

## 2010-10-31 LAB — BASIC METABOLIC PANEL
Calcium: 9.2 mg/dL (ref 8.4–10.5)
Creatinine, Ser: 0.8 mg/dL (ref 0.50–1.35)
GFR calc non Af Amer: >60 mL/min (ref >60)
Glucose, Bld: 101 mg/dL — ABNORMAL HIGH (ref 70–99)
Sodium: 136 mEq/L (ref 135–145)

## 2010-10-31 LAB — PROTIME-INR: INR: 1.99 — ABNORMAL HIGH (ref 0.00–1.49)

## 2010-10-31 LAB — CBC
RBC: 4.53 MIL/uL (ref 4.22–5.81)
RDW: 12.8 % (ref 11.5–15.5)
WBC: 11.2 10*3/uL — ABNORMAL HIGH (ref 4.0–10.5)

## 2010-11-01 ENCOUNTER — Ambulatory Visit (INDEPENDENT_AMBULATORY_CARE_PROVIDER_SITE_OTHER): Payer: Self-pay | Admitting: *Deleted

## 2010-11-01 DIAGNOSIS — I2699 Other pulmonary embolism without acute cor pulmonale: Secondary | ICD-10-CM

## 2010-11-01 DIAGNOSIS — I743 Embolism and thrombosis of arteries of the lower extremities: Secondary | ICD-10-CM

## 2010-11-01 DIAGNOSIS — D6862 Lupus anticoagulant syndrome: Secondary | ICD-10-CM

## 2010-11-01 DIAGNOSIS — D6859 Other primary thrombophilia: Secondary | ICD-10-CM

## 2010-11-01 DIAGNOSIS — Z7901 Long term (current) use of anticoagulants: Secondary | ICD-10-CM

## 2010-11-01 LAB — POCT INR: INR: 2.6

## 2010-11-29 ENCOUNTER — Ambulatory Visit (INDEPENDENT_AMBULATORY_CARE_PROVIDER_SITE_OTHER): Payer: Self-pay | Admitting: *Deleted

## 2010-11-29 DIAGNOSIS — I743 Embolism and thrombosis of arteries of the lower extremities: Secondary | ICD-10-CM

## 2010-11-29 DIAGNOSIS — D6862 Lupus anticoagulant syndrome: Secondary | ICD-10-CM

## 2010-11-29 DIAGNOSIS — D6859 Other primary thrombophilia: Secondary | ICD-10-CM

## 2010-11-29 DIAGNOSIS — I2699 Other pulmonary embolism without acute cor pulmonale: Secondary | ICD-10-CM

## 2010-11-29 DIAGNOSIS — Z7901 Long term (current) use of anticoagulants: Secondary | ICD-10-CM

## 2010-11-29 LAB — POCT INR: INR: 2

## 2010-12-27 ENCOUNTER — Encounter: Payer: Self-pay | Admitting: *Deleted

## 2010-12-31 ENCOUNTER — Encounter: Payer: Self-pay | Admitting: *Deleted

## 2011-01-09 ENCOUNTER — Ambulatory Visit (INDEPENDENT_AMBULATORY_CARE_PROVIDER_SITE_OTHER): Payer: Self-pay | Admitting: *Deleted

## 2011-01-09 DIAGNOSIS — I2699 Other pulmonary embolism without acute cor pulmonale: Secondary | ICD-10-CM

## 2011-01-09 DIAGNOSIS — D6862 Lupus anticoagulant syndrome: Secondary | ICD-10-CM

## 2011-01-09 DIAGNOSIS — I743 Embolism and thrombosis of arteries of the lower extremities: Secondary | ICD-10-CM

## 2011-01-09 DIAGNOSIS — D6859 Other primary thrombophilia: Secondary | ICD-10-CM

## 2011-01-09 DIAGNOSIS — Z7901 Long term (current) use of anticoagulants: Secondary | ICD-10-CM

## 2011-01-24 LAB — PROTIME-INR: INR: 1.6 — ABNORMAL HIGH

## 2011-01-24 LAB — CULTURE, ROUTINE-ABSCESS: Gram Stain: NONE SEEN

## 2011-01-31 LAB — WOUND CULTURE: Gram Stain: NONE SEEN

## 2011-02-06 ENCOUNTER — Ambulatory Visit (INDEPENDENT_AMBULATORY_CARE_PROVIDER_SITE_OTHER): Payer: Self-pay | Admitting: *Deleted

## 2011-02-06 DIAGNOSIS — I743 Embolism and thrombosis of arteries of the lower extremities: Secondary | ICD-10-CM

## 2011-02-06 DIAGNOSIS — Z7901 Long term (current) use of anticoagulants: Secondary | ICD-10-CM

## 2011-02-06 DIAGNOSIS — D6859 Other primary thrombophilia: Secondary | ICD-10-CM

## 2011-02-06 DIAGNOSIS — D6862 Lupus anticoagulant syndrome: Secondary | ICD-10-CM

## 2011-02-06 DIAGNOSIS — I2699 Other pulmonary embolism without acute cor pulmonale: Secondary | ICD-10-CM

## 2011-02-06 LAB — POCT INR: INR: 2.1

## 2011-02-12 LAB — STREP A DNA PROBE

## 2011-03-06 ENCOUNTER — Encounter: Payer: Self-pay | Admitting: *Deleted

## 2011-03-14 ENCOUNTER — Ambulatory Visit (INDEPENDENT_AMBULATORY_CARE_PROVIDER_SITE_OTHER): Payer: Self-pay | Admitting: *Deleted

## 2011-03-14 DIAGNOSIS — Z7901 Long term (current) use of anticoagulants: Secondary | ICD-10-CM

## 2011-03-14 DIAGNOSIS — D6859 Other primary thrombophilia: Secondary | ICD-10-CM

## 2011-03-14 DIAGNOSIS — D6862 Lupus anticoagulant syndrome: Secondary | ICD-10-CM

## 2011-03-14 DIAGNOSIS — I743 Embolism and thrombosis of arteries of the lower extremities: Secondary | ICD-10-CM

## 2011-03-14 DIAGNOSIS — I2699 Other pulmonary embolism without acute cor pulmonale: Secondary | ICD-10-CM

## 2011-03-27 ENCOUNTER — Telehealth: Payer: Self-pay | Admitting: Cardiology

## 2011-03-27 MED ORDER — WARFARIN SODIUM 5 MG PO TABS: ORAL_TABLET | ORAL | Status: DC

## 2011-03-27 NOTE — Telephone Encounter (Signed)
**Note De-Identified Isabele Lollar Obfuscation** Mr. Portman is overdue for f/u. Appt. schedule with Dr. Dietrich Pates on 04-04-11 at 4:00pm. Coumadin refill sent to [facility] for refill, pt. Is aware./LV

## 2011-03-27 NOTE — Telephone Encounter (Signed)
Needs refill on Coumadin called to [facility] in [place]/tg

## 2011-04-04 ENCOUNTER — Encounter: Payer: Self-pay | Admitting: Cardiology

## 2011-04-04 ENCOUNTER — Ambulatory Visit (INDEPENDENT_AMBULATORY_CARE_PROVIDER_SITE_OTHER): Payer: Self-pay | Admitting: Cardiology

## 2011-04-04 DIAGNOSIS — I2699 Other pulmonary embolism without acute cor pulmonale: Secondary | ICD-10-CM

## 2011-04-04 DIAGNOSIS — K7689 Other specified diseases of liver: Secondary | ICD-10-CM

## 2011-04-04 DIAGNOSIS — Z720 Tobacco use: Secondary | ICD-10-CM

## 2011-04-04 DIAGNOSIS — Z7901 Long term (current) use of anticoagulants: Secondary | ICD-10-CM

## 2011-04-04 DIAGNOSIS — K760 Fatty (change of) liver, not elsewhere classified: Secondary | ICD-10-CM

## 2011-04-04 DIAGNOSIS — F17200 Nicotine dependence, unspecified, uncomplicated: Secondary | ICD-10-CM

## 2011-04-04 HISTORY — DX: Fatty (change of) liver, not elsewhere classified: K76.0

## 2011-04-04 NOTE — Assessment & Plan Note (Signed)
Normal CBC in 10/2010; Hemoccult testing of stools is pending.

## 2011-04-04 NOTE — Progress Notes (Signed)
Patient ID: Andrew [name], male   DOB: 1970/07/20, 40 y.o.   MRN: [id number] HPI: Andrew [name] returns to the office as scheduled for continued assessment and treatment of a history of pulmonary embolism and lupus anticoagulant requiring chronic anticoagulation.  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  He notes no bleeding, melena, hematochezia or hematemesis.  He has had no dyspnea nor chest discomfort.  He experienced an episode of lightheadedness prompting evaluation in the emergency department a few months ago at which time no etiology was identified.  This was the first and last time that he had those symptoms.  He is unclear whether he was experiencing vertigo or near syncope.  Anticoagulation has been stable and typically low therapeutic as assessed in Coumadin Clinic.  Prior to Admission medications   Medication Sig Start Date End Date Taking? Authorizing Provider  warfarin (COUMADIN) 5 MG tablet Take 1 tablet daily except 1 1/2 tablets on Tuesdays & Thursdays or as directed by Anticoagulation clinic. 03/27/11  Yes Andrew Friends. Dietrich Pates, MD  No Known Allergies    Past medical history, social history, and family history reviewed and updated.  ROS: See history of present illness.  PHYSICAL EXAM: BP 113/67  Pulse 77  Ht 6' (1.829 m)  Wt 100.245 kg (221 lb)  BMI 29.97 kg/m2  SpO2 97%  General-Well developed; no acute distress Body habitus-mildly overweight Neck-No JVD; no carotid bruits Lungs-minor expiratory wheeze; resonant to percussion Cardiovascular-normal PMI; normal S1 and S2 Abdomen-normal bowel sounds; soft and non-tender without masses or organomegaly Musculoskeletal-No deformities, no cyanosis or clubbing Neurologic-Normal cranial nerves; symmetric strength and tone Skin-Warm, no significant lesions Extremities-distal pulses intact; no edema  ASSESSMENT AND PLAN:  [place] Bing, MD 04/04/2011 4:26 PM

## 2011-04-04 NOTE — Assessment & Plan Note (Signed)
Patient has stopped for 6 months in the past.  Another quit attempt was recommended, but Andrew [name] is not prepared to commit to that at present.  If he attempts smoking cessation and fails with and without the assistance of transdermal nicotine, he will call for further advice and treatment.

## 2011-04-04 NOTE — Assessment & Plan Note (Signed)
No symptoms to suggest recurrent events or pulmonary hypertension.

## 2011-04-15 ENCOUNTER — Ambulatory Visit (INDEPENDENT_AMBULATORY_CARE_PROVIDER_SITE_OTHER): Payer: Self-pay | Admitting: *Deleted

## 2011-04-15 DIAGNOSIS — D6862 Lupus anticoagulant syndrome: Secondary | ICD-10-CM

## 2011-04-15 DIAGNOSIS — I743 Embolism and thrombosis of arteries of the lower extremities: Secondary | ICD-10-CM

## 2011-04-15 DIAGNOSIS — I2699 Other pulmonary embolism without acute cor pulmonale: Secondary | ICD-10-CM

## 2011-04-15 DIAGNOSIS — D6859 Other primary thrombophilia: Secondary | ICD-10-CM

## 2011-04-15 DIAGNOSIS — Z7901 Long term (current) use of anticoagulants: Secondary | ICD-10-CM

## 2011-04-15 LAB — POCT INR: INR: 2

## 2011-05-20 ENCOUNTER — Ambulatory Visit (INDEPENDENT_AMBULATORY_CARE_PROVIDER_SITE_OTHER): Payer: Self-pay | Admitting: *Deleted

## 2011-05-20 DIAGNOSIS — D6862 Lupus anticoagulant syndrome: Secondary | ICD-10-CM

## 2011-05-20 DIAGNOSIS — D6859 Other primary thrombophilia: Secondary | ICD-10-CM

## 2011-05-20 DIAGNOSIS — I743 Embolism and thrombosis of arteries of the lower extremities: Secondary | ICD-10-CM

## 2011-05-20 DIAGNOSIS — Z7901 Long term (current) use of anticoagulants: Secondary | ICD-10-CM

## 2011-05-20 DIAGNOSIS — I2699 Other pulmonary embolism without acute cor pulmonale: Secondary | ICD-10-CM

## 2011-07-08 ENCOUNTER — Ambulatory Visit (INDEPENDENT_AMBULATORY_CARE_PROVIDER_SITE_OTHER): Payer: Self-pay | Admitting: *Deleted

## 2011-07-08 DIAGNOSIS — D6862 Lupus anticoagulant syndrome: Secondary | ICD-10-CM

## 2011-07-08 DIAGNOSIS — Z7901 Long term (current) use of anticoagulants: Secondary | ICD-10-CM

## 2011-07-08 DIAGNOSIS — I743 Embolism and thrombosis of arteries of the lower extremities: Secondary | ICD-10-CM

## 2011-07-08 DIAGNOSIS — D6859 Other primary thrombophilia: Secondary | ICD-10-CM

## 2011-07-08 DIAGNOSIS — I2699 Other pulmonary embolism without acute cor pulmonale: Secondary | ICD-10-CM

## 2011-07-08 LAB — POCT INR: INR: 2.4

## 2011-08-05 ENCOUNTER — Encounter (HOSPITAL_COMMUNITY): Payer: Self-pay | Admitting: *Deleted

## 2011-08-05 DIAGNOSIS — Z86718 Personal history of other venous thrombosis and embolism: Secondary | ICD-10-CM | POA: Insufficient documentation

## 2011-08-05 DIAGNOSIS — IMO0002 Reserved for concepts with insufficient information to code with codable children: Secondary | ICD-10-CM | POA: Insufficient documentation

## 2011-08-05 NOTE — ED Notes (Signed)
C/o abscessed area to left bicep, onset 4 days ago, gradually worsening.

## 2011-08-06 ENCOUNTER — Emergency Department (HOSPITAL_COMMUNITY)
Admission: EM | Admit: 2011-08-06 | Discharge: 2011-08-06 | Disposition: A | Discharge status: Home / Self Care | Payer: Self-pay | Attending: Emergency Medicine | Admitting: Emergency Medicine

## 2011-08-06 DIAGNOSIS — L02414 Cutaneous abscess of left upper limb: Secondary | ICD-10-CM

## 2011-08-06 MED ORDER — OXYCODONE-ACETAMINOPHEN 5-325 MG PO TABS: 2.0000 | ORAL_TABLET | ORAL | Status: AC | PRN

## 2011-08-06 MED ORDER — LIDOCAINE HCL (PF) 2 % IJ SOLN
INTRAMUSCULAR | Status: AC
  Administered 2011-08-06: 10 mL
  Filled 2011-08-06: qty 10

## 2011-08-06 MED ORDER — SULFAMETHOXAZOLE-TRIMETHOPRIM 800-160 MG PO TABS: 1.0000 | ORAL_TABLET | Freq: Two times a day (BID) | ORAL | Status: AC

## 2011-08-06 MED ORDER — BACITRACIN-NEOMYCIN-POLYMYXIN 400-5-5000 EX OINT
TOPICAL_OINTMENT | CUTANEOUS | Status: AC
  Administered 2011-08-06: 1
  Filled 2011-08-06: qty 1

## 2011-08-06 MED ORDER — HYDROMORPHONE HCL PF 1 MG/ML IJ SOLN
1.0000 mg | Freq: Once | INTRAMUSCULAR | Status: AC
  Administered 2011-08-06: 1 mg via INTRAVENOUS
  Filled 2011-08-06: qty 1

## 2011-08-06 MED ORDER — ONDANSETRON HCL 4 MG/2ML IJ SOLN
4.0000 mg | Freq: Once | INTRAMUSCULAR | Status: AC
  Administered 2011-08-06: 4 mg via INTRAVENOUS
  Filled 2011-08-06: qty 2

## 2011-08-06 MED ORDER — VANCOMYCIN HCL IN DEXTROSE 1-5 GM/200ML-% IV SOLN
1000.0000 mg | Freq: Once | INTRAVENOUS | Status: AC
  Administered 2011-08-06: 1000 mg via INTRAVENOUS
  Filled 2011-08-06: qty 200

## 2011-08-06 NOTE — ED Provider Notes (Signed)
History     CSN: [id number]  Arrival date & time 08/05/11  2329   First MD Initiated Contact with Patient 08/06/11 0120      Chief Complaint  Patient presents with  . Abscess    left bicep    (Consider location/radiation/quality/duration/timing/severity/associated sxs/prior treatment) HPI:  Abscess to distal aspect of left upper extremity. Patient does not use intravenous drugs. He may have been bitten but is unsure. No fever sweats or chills. Nothing makes it better or worse. Symptoms are moderate. Duration 3 days.  Past Medical History  Diagnosis Date  . Pulmonary embolism     2006  . Hepatic steatosis 04/04/2011    Past Surgical History  Procedure Date  . Chest tube insertion     for pulmonary embolism  . Colonoscopy none    Not yet performed or indicated as of 2012    No family history on file.  History  Substance Use Topics  . Smoking status: Current Everyday Smoker -- 1.0 packs/day    Types: Cigarettes  . Smokeless tobacco: Not on file  . Alcohol Use: No      Review of Systems  All other systems reviewed and are negative.    Allergies  Review of patient's allergies indicates no known allergies.  Home Medications   Current Outpatient Rx  Name Route Sig Dispense Refill  . OXYCODONE-ACETAMINOPHEN 5-325 MG PO TABS Oral Take 2 tablets by mouth every 4 (four) hours as needed for pain. 20 tablet 0  . SULFAMETHOXAZOLE-TRIMETHOPRIM 800-160 MG PO TABS Oral Take 1 tablet by mouth 2 (two) times daily. 20 tablet 0  . WARFARIN SODIUM 5 MG PO TABS  Take 1 tablet daily except 1 1/2 tablets on Tuesdays & Thursdays or as directed by Anticoagulation clinic. 45 tablet 3    BP 110/73  Pulse 81  Temp(Src) 97.8 F (36.6 C) (Oral)  Resp 18  Ht 6' (1.829 m)  Wt 225 lb (102.059 kg)  BMI 30.52 kg/m2  SpO2 99%  Physical Exam  Constitutional: He is oriented to person, place, and time. He appears well-developed and well-nourished.  HENT:  Head: Normocephalic.    Musculoskeletal: Normal range of motion.  Neurological: He is alert and oriented to person, place, and time.  Skin:       Left upper extremity: Distal anterior aspect of the humeral area examined.  2.5 x 2.5 CM abscess noted with central core.  Psychiatric: He has a normal mood and affect.    ED Course  Procedures (including critical care time)  Labs Reviewed - No data to display No results found.   1. Cutaneous abscess of left upper extremity    Procedure note:  Wound was cleaned with Betadine. Anesthesia with 2% Xylocaine plain x10 cc.  Stab made with #11 blade. Minimal amount of pus expressed. Loculations were broken with a hemostat. Wound was irrigated with normal saline. Sterile dressing applied with Neosporin. Patient tolerated procedure well  MDM  I&D of wound attempted. Small amount of pus expressed. Intravenous vancomycin given. Discharge home with Septra and pain medication. Recheck tomorrow. Patient and wife understand and agree.        Donnetta Hutching, MD 08/06/11 [phone]

## 2011-08-06 NOTE — Discharge Instructions (Signed)
Abscess An abscess (boil or furuncle) is an infected area that contains a collection of pus.  SYMPTOMS Signs and symptoms of an abscess include pain, tenderness, redness, or hardness. You may feel a moveable soft area under your skin. An abscess can occur anywhere in the body.  TREATMENT  A surgical cut (incision) may be made over your abscess to drain the pus. Gauze may be packed into the space or a drain may be looped through the abscess cavity (pocket). This provides a drain that will allow the cavity to heal from the inside outwards. The abscess may be painful for a few days, but should feel much better if it was drained.  Your abscess, if seen early, may not have localized and may not have been drained. If not, another appointment may be required if it does not get better on its own or with medications. HOME CARE INSTRUCTIONS   Only take over-the-counter or prescription medicines for pain, discomfort, or fever as directed by your caregiver.   Take your antibiotics as directed if they were prescribed. Finish them even if you start to feel better.   Keep the skin and clothes clean around your abscess.   If the abscess was drained, you will need to use gauze dressing to collect any draining pus. Dressings will typically need to be changed 3 or more times a day.   The infection may spread by skin contact with others. Avoid skin contact as much as possible.   Practice good hygiene. This includes regular hand washing, cover any draining skin lesions, and do not share personal care items.   If you participate in sports, do not share athletic equipment, towels, whirlpools, or personal care items. Shower after every practice or tournament.   If a draining area cannot be adequately covered:   Do not participate in sports.   Children should not participate in day care until the wound has healed or drainage stops.   If your caregiver has given you a follow-up appointment, it is very important  to keep that appointment. Not keeping the appointment could result in a much worse infection, chronic or permanent injury, pain, and disability. If there is any problem keeping the appointment, you must call back to this facility for assistance.  SEEK MEDICAL CARE IF:   You develop increased pain, swelling, redness, drainage, or bleeding in the wound site.   You develop signs of generalized infection including muscle aches, chills, fever, or a general ill feeling.   You have an oral temperature above 102 F (38.9 C).  MAKE SURE YOU:   Understand these instructions.   Will watch your condition.   Will get help right away if you are not doing well or get worse.  Document Released: 01/30/2005 Document Revised: 04/11/2011 Document Reviewed: 11/24/2007 [facility] Patient Information 2012 [place], [place].  Elevate arm. Start antibiotic tomorrow morning. Pain medication. Can shower tomorrow. Redress with clean dressing. Recheck tomorrow afternoon

## 2011-08-19 ENCOUNTER — Ambulatory Visit (INDEPENDENT_AMBULATORY_CARE_PROVIDER_SITE_OTHER): Payer: Self-pay | Admitting: *Deleted

## 2011-08-19 DIAGNOSIS — D6859 Other primary thrombophilia: Secondary | ICD-10-CM

## 2011-08-19 DIAGNOSIS — I2699 Other pulmonary embolism without acute cor pulmonale: Secondary | ICD-10-CM

## 2011-08-19 DIAGNOSIS — D6862 Lupus anticoagulant syndrome: Secondary | ICD-10-CM

## 2011-08-19 DIAGNOSIS — Z7901 Long term (current) use of anticoagulants: Secondary | ICD-10-CM

## 2011-08-19 DIAGNOSIS — I743 Embolism and thrombosis of arteries of the lower extremities: Secondary | ICD-10-CM

## 2011-08-19 LAB — POCT INR: INR: 3.2

## 2011-09-03 ENCOUNTER — Telehealth: Payer: Self-pay | Admitting: Cardiology

## 2011-09-03 MED ORDER — WARFARIN SODIUM 5 MG PO TABS: ORAL_TABLET | ORAL | Status: DC

## 2011-09-03 NOTE — Telephone Encounter (Signed)
RX sent into pharmacy.unable to leave message/ct

## 2011-09-03 NOTE — Telephone Encounter (Signed)
Pt needs warfrin called in today he is out/tmj

## 2011-09-09 ENCOUNTER — Ambulatory Visit (INDEPENDENT_AMBULATORY_CARE_PROVIDER_SITE_OTHER): Payer: Self-pay | Admitting: *Deleted

## 2011-09-09 DIAGNOSIS — D6859 Other primary thrombophilia: Secondary | ICD-10-CM

## 2011-09-09 DIAGNOSIS — D6862 Lupus anticoagulant syndrome: Secondary | ICD-10-CM

## 2011-09-09 DIAGNOSIS — Z7901 Long term (current) use of anticoagulants: Secondary | ICD-10-CM

## 2011-09-09 DIAGNOSIS — I743 Embolism and thrombosis of arteries of the lower extremities: Secondary | ICD-10-CM

## 2011-09-09 DIAGNOSIS — I2699 Other pulmonary embolism without acute cor pulmonale: Secondary | ICD-10-CM

## 2011-09-09 LAB — POCT INR: INR: 1.6

## 2011-09-19 ENCOUNTER — Encounter (HOSPITAL_COMMUNITY): Payer: Self-pay | Admitting: *Deleted

## 2011-09-19 ENCOUNTER — Emergency Department (HOSPITAL_COMMUNITY)
Admission: EM | Admit: 2011-09-19 | Discharge: 2011-09-19 | Disposition: A | Discharge status: Home / Self Care | Payer: Self-pay | Attending: Emergency Medicine | Admitting: Emergency Medicine

## 2011-09-19 DIAGNOSIS — L03114 Cellulitis of left upper limb: Secondary | ICD-10-CM

## 2011-09-19 DIAGNOSIS — IMO0002 Reserved for concepts with insufficient information to code with codable children: Secondary | ICD-10-CM | POA: Insufficient documentation

## 2011-09-19 MED ORDER — OXYCODONE-ACETAMINOPHEN 5-325 MG PO TABS: 1.0000 | ORAL_TABLET | Freq: Four times a day (QID) | ORAL | Status: AC | PRN

## 2011-09-19 MED ORDER — HYDROMORPHONE HCL PF 1 MG/ML IJ SOLN
1.0000 mg | Freq: Once | INTRAMUSCULAR | Status: AC
  Administered 2011-09-19: 1 mg via INTRAVENOUS
  Filled 2011-09-19: qty 1

## 2011-09-19 MED ORDER — HYDROMORPHONE HCL PF 2 MG/ML IJ SOLN
2.0000 mg | Freq: Once | INTRAMUSCULAR | Status: AC
  Administered 2011-09-19: 2 mg via INTRAVENOUS
  Filled 2011-09-19: qty 1

## 2011-09-19 MED ORDER — ONDANSETRON HCL 4 MG/2ML IJ SOLN
4.0000 mg | Freq: Once | INTRAMUSCULAR | Status: AC
  Administered 2011-09-19: 4 mg via INTRAVENOUS
  Filled 2011-09-19: qty 2

## 2011-09-19 MED ORDER — LIDOCAINE-EPINEPHRINE (PF) 1 %-1:200000 IJ SOLN
INTRAMUSCULAR | Status: AC
  Administered 2011-09-19: 10 mL
  Filled 2011-09-19: qty 10

## 2011-09-19 MED ORDER — VANCOMYCIN HCL IN DEXTROSE 1-5 GM/200ML-% IV SOLN
1000.0000 mg | Freq: Once | INTRAVENOUS | Status: AC
  Administered 2011-09-19: 1000 mg via INTRAVENOUS
  Filled 2011-09-19: qty 200

## 2011-09-19 MED ORDER — BACITRACIN-NEOMYCIN-POLYMYXIN 400-5-5000 EX OINT
TOPICAL_OINTMENT | Freq: Once | CUTANEOUS | Status: AC
  Administered 2011-09-19: 1 via TOPICAL
  Filled 2011-09-19: qty 1

## 2011-09-19 MED ORDER — SULFAMETHOXAZOLE-TRIMETHOPRIM 800-160 MG PO TABS: 1.0000 | ORAL_TABLET | Freq: Two times a day (BID) | ORAL | Status: AC

## 2011-09-19 NOTE — Discharge Instructions (Signed)
Abscess An abscess (boil or furuncle) is an infected area under your skin. This area is filled with yellowish white fluid (pus). HOME CARE   Only take medicine as told by your doctor.   Keep the skin clean around your abscess. Keep clothes that may touch the abscess clean.   Change any bandages (dressings) as told by your doctor.   Avoid direct skin contact with other people. The infection can spread by skin contact with others.   Practice good hygiene and do not share personal care items.   Do not share athletic equipment, towels, or whirlpools. Shower after every practice or work out session.   If a draining area cannot be covered:   Do not play sports.   Children should not go to daycare until the wound has healed or until fluid (drainage) stops coming out of the wound.   See your doctor for a follow-up visit as told.  GET HELP RIGHT AWAY IF:   There is more pain, puffiness (swelling), and redness in the wound site.   There is fluid or bleeding from the wound site.   You have muscle aches, chills, fever, or feel sick.   You or your child has a temperature by mouth above 102 F (38.9 C), not controlled by medicine.   Your baby is older than 3 months with a rectal temperature of 102 F (38.9 C) or higher.  MAKE SURE YOU:   Understand these instructions.   Will watch your condition.   Will get help right away if you are not doing well or get worse.  Document Released: 10/09/2007 Document Revised: 04/11/2011 Document Reviewed: 10/09/2007 [facility] Patient Information 2012 [place], [place].   Shower daily.  Flush wound with soap and water. Simple dressing. Antibiotic for 10 days. Pain medication. Return here if worse in any way including increased redness and swelling, fever, chills

## 2011-09-19 NOTE — ED Notes (Signed)
Suture tray, betadine swabs, gloves, 11 blade, and lidocaine sitting at bedside.

## 2011-09-19 NOTE — ED Provider Notes (Signed)
History   This chart was scribed for Donnetta Hutching, MD by Shari Heritage. The patient was seen in room APA04/APA04. Patient's care was started at 1808.     CSN: [id number]  Arrival date & time 09/19/11  4098   First MD Initiated Contact with Patient 09/19/11 2058      Chief Complaint  Patient presents with  . Arm Pain    (Consider location/radiation/quality/duration/timing/severity/associated sxs/prior treatment) HPI Andrew [name] is a 41 y.o. male who presents to the Emergency Department complaining of of moderate to severe, sharp, persistent pain in left forearm due to an abscess. Pain onset 4 days ago while completing construction work on his house. Patient says that the area around the abscess where he feels pain often feels warm. Patient did not mention any relieving factors. Movement and palpation worsens pain. Patient has had two similar infections prior to this visit. Dr. Adriana Simas performed an I&D in April 2013 on an abscess located on left upper extremity. Patient with history of pulmonary embolism and hepatic steatosis. Patient has had a colonoscopy.   Past Medical History  Diagnosis Date  . Pulmonary embolism     2006  . Hepatic steatosis 04/04/2011    Past Surgical History  Procedure Date  . Chest tube insertion     for pulmonary embolism  . Colonoscopy none    Not yet performed or indicated as of 2012    No family history on file.  History  Substance Use Topics  . Smoking status: Current Everyday Smoker -- 1.0 packs/day    Types: Cigarettes  . Smokeless tobacco: Not on file  . Alcohol Use: No      Review of Systems Positive for left arm pain, swelling, erythema. Negative for chills, HA, fever, abdominal pain, chest pain, vomiting, nausea, and diarrhea.  Allergies  Review of patient's allergies indicates no known allergies.  Home Medications   Current Outpatient Rx  Name Route Sig Dispense Refill  . WARFARIN SODIUM 5 MG PO TABS  Take 1 tablet daily  except 1 1/2 tablets on Tuesdays & Thursdays or as directed by Anticoagulation clinic. 45 tablet 3    BP 116/75  Pulse 98  Temp(Src) 98.4 F (36.9 C) (Oral)  Resp 16  Ht 6' (1.829 m)  Wt 225 lb (102.059 kg)  BMI 30.52 kg/m2  SpO2 99%  Physical Exam  Nursing note and vitals reviewed. Constitutional: He is oriented to person, place, and time. He appears well-developed and well-nourished.  HENT:  Head: Normocephalic and atraumatic.  Eyes: Conjunctivae and EOM are normal. Pupils are equal, round, and reactive to light.  Neck: Normal range of motion. Neck supple.  Cardiovascular: Normal rate and regular rhythm.   Pulmonary/Chest: Effort normal and breath sounds normal.  Abdominal: Soft. Bowel sounds are normal.  Musculoskeletal: Normal range of motion.  Neurological: He is alert and oriented to person, place, and time.  Skin: Skin is warm and dry.       Abscess on left posterior medial proximal forearm  Psychiatric: He has a normal mood and affect.    ED Course  Procedures (including critical care time) DIAGNOSTIC STUDIES: Oxygen Saturation is 99% on room air, normal by my interpretation.    COORDINATION OF CARE: 9:18PM- Patient informed of current plan for treatment and evaluation and agrees with plan at this time. Patient has an abscess of left posterior medial proximal forearm. Will administer IV fluids and pain medication. Will perform an I&D on abscess.   10:18PM- Performed an  I&D procedure. Administered 1% Xylocaine x 8 ccs with a 25-gauge needle to localized area around abscess. Stabbed abscess area with an #11 blade making a 3-4 mm incision. Minimal amount of sebaceous material was released. Irrigated incision area and applied dressing. Will administer 1g of Vancomycin by IV and prescribe Septra antibiotic pills for patient to begin taking tomorrow morning. Recommended that patient take a shower to further irrigate the area and apply a simple dressing.    Labs Reviewed -  No data to display No results found.   No diagnosis found.    MDM  Patient has obvious abscess in left proximal forearm.  I&D yielded small amount of sebaceous material.  Discharge home on Septra and Percocet.  Patient instructed to wash wound daily and return if worse      I personally performed the services described in this documentation, which was scribed in my presence. The recorded information has been reviewed and considered.    Donnetta Hutching, MD 09/19/11 [phone]

## 2011-09-19 NOTE — ED Notes (Signed)
Abscess to left forearm x 4 days with redness/swelling/pain.

## 2011-09-26 ENCOUNTER — Ambulatory Visit (INDEPENDENT_AMBULATORY_CARE_PROVIDER_SITE_OTHER): Payer: Self-pay | Admitting: *Deleted

## 2011-09-26 DIAGNOSIS — I2699 Other pulmonary embolism without acute cor pulmonale: Secondary | ICD-10-CM

## 2011-09-26 DIAGNOSIS — I743 Embolism and thrombosis of arteries of the lower extremities: Secondary | ICD-10-CM

## 2011-09-26 DIAGNOSIS — Z7901 Long term (current) use of anticoagulants: Secondary | ICD-10-CM

## 2011-09-26 DIAGNOSIS — D6859 Other primary thrombophilia: Secondary | ICD-10-CM

## 2011-09-26 DIAGNOSIS — D6862 Lupus anticoagulant syndrome: Secondary | ICD-10-CM

## 2011-10-24 ENCOUNTER — Ambulatory Visit (INDEPENDENT_AMBULATORY_CARE_PROVIDER_SITE_OTHER): Payer: Self-pay | Admitting: *Deleted

## 2011-10-24 DIAGNOSIS — D6859 Other primary thrombophilia: Secondary | ICD-10-CM

## 2011-10-24 DIAGNOSIS — Z7901 Long term (current) use of anticoagulants: Secondary | ICD-10-CM

## 2011-10-24 DIAGNOSIS — I743 Embolism and thrombosis of arteries of the lower extremities: Secondary | ICD-10-CM

## 2011-10-24 DIAGNOSIS — I2699 Other pulmonary embolism without acute cor pulmonale: Secondary | ICD-10-CM

## 2011-10-24 DIAGNOSIS — D6862 Lupus anticoagulant syndrome: Secondary | ICD-10-CM

## 2011-11-21 ENCOUNTER — Ambulatory Visit (INDEPENDENT_AMBULATORY_CARE_PROVIDER_SITE_OTHER): Payer: Self-pay | Admitting: *Deleted

## 2011-11-21 DIAGNOSIS — I743 Embolism and thrombosis of arteries of the lower extremities: Secondary | ICD-10-CM

## 2011-11-21 DIAGNOSIS — I2699 Other pulmonary embolism without acute cor pulmonale: Secondary | ICD-10-CM

## 2011-11-21 DIAGNOSIS — D6859 Other primary thrombophilia: Secondary | ICD-10-CM

## 2011-11-21 DIAGNOSIS — D6862 Lupus anticoagulant syndrome: Secondary | ICD-10-CM

## 2011-11-21 DIAGNOSIS — Z7901 Long term (current) use of anticoagulants: Secondary | ICD-10-CM

## 2011-11-21 LAB — POCT INR: INR: 2.4

## 2011-12-26 ENCOUNTER — Ambulatory Visit (INDEPENDENT_AMBULATORY_CARE_PROVIDER_SITE_OTHER): Payer: Self-pay | Admitting: *Deleted

## 2011-12-26 DIAGNOSIS — I2699 Other pulmonary embolism without acute cor pulmonale: Secondary | ICD-10-CM

## 2011-12-26 DIAGNOSIS — I743 Embolism and thrombosis of arteries of the lower extremities: Secondary | ICD-10-CM

## 2011-12-26 DIAGNOSIS — D6859 Other primary thrombophilia: Secondary | ICD-10-CM

## 2011-12-26 DIAGNOSIS — Z7901 Long term (current) use of anticoagulants: Secondary | ICD-10-CM

## 2011-12-26 DIAGNOSIS — D6862 Lupus anticoagulant syndrome: Secondary | ICD-10-CM

## 2012-02-06 ENCOUNTER — Ambulatory Visit (INDEPENDENT_AMBULATORY_CARE_PROVIDER_SITE_OTHER): Payer: Self-pay | Admitting: *Deleted

## 2012-02-06 DIAGNOSIS — I2699 Other pulmonary embolism without acute cor pulmonale: Secondary | ICD-10-CM

## 2012-02-06 DIAGNOSIS — I743 Embolism and thrombosis of arteries of the lower extremities: Secondary | ICD-10-CM

## 2012-02-06 DIAGNOSIS — D6862 Lupus anticoagulant syndrome: Secondary | ICD-10-CM

## 2012-02-06 DIAGNOSIS — Z7901 Long term (current) use of anticoagulants: Secondary | ICD-10-CM

## 2012-02-06 DIAGNOSIS — D6859 Other primary thrombophilia: Secondary | ICD-10-CM

## 2012-03-26 ENCOUNTER — Ambulatory Visit (INDEPENDENT_AMBULATORY_CARE_PROVIDER_SITE_OTHER): Payer: Self-pay | Admitting: *Deleted

## 2012-03-26 DIAGNOSIS — D6862 Lupus anticoagulant syndrome: Secondary | ICD-10-CM

## 2012-03-26 DIAGNOSIS — Z7901 Long term (current) use of anticoagulants: Secondary | ICD-10-CM

## 2012-03-26 DIAGNOSIS — I2699 Other pulmonary embolism without acute cor pulmonale: Secondary | ICD-10-CM

## 2012-03-26 DIAGNOSIS — I743 Embolism and thrombosis of arteries of the lower extremities: Secondary | ICD-10-CM

## 2012-03-26 DIAGNOSIS — D6859 Other primary thrombophilia: Secondary | ICD-10-CM

## 2012-03-26 LAB — POCT INR: INR: 2.4

## 2012-03-30 ENCOUNTER — Other Ambulatory Visit: Payer: Self-pay | Admitting: Cardiology

## 2012-03-30 ENCOUNTER — Other Ambulatory Visit: Payer: Self-pay | Admitting: *Deleted

## 2012-03-30 MED ORDER — WARFARIN SODIUM 5 MG PO TABS: 5.0000 mg | ORAL_TABLET | ORAL | Status: DC

## 2012-05-11 ENCOUNTER — Encounter: Payer: Self-pay | Admitting: Cardiology

## 2012-05-13 ENCOUNTER — Ambulatory Visit (INDEPENDENT_AMBULATORY_CARE_PROVIDER_SITE_OTHER): Payer: Self-pay | Admitting: *Deleted

## 2012-05-13 ENCOUNTER — Ambulatory Visit: Payer: Self-pay | Admitting: Cardiology

## 2012-05-13 ENCOUNTER — Ambulatory Visit (INDEPENDENT_AMBULATORY_CARE_PROVIDER_SITE_OTHER): Payer: Self-pay | Admitting: Adult Health

## 2012-05-13 ENCOUNTER — Encounter: Payer: Self-pay | Admitting: Adult Health

## 2012-05-13 VITALS — BP 102/61 | HR 80 | Wt 212.0 lb

## 2012-05-13 DIAGNOSIS — K760 Fatty (change of) liver, not elsewhere classified: Secondary | ICD-10-CM

## 2012-05-13 DIAGNOSIS — Z7901 Long term (current) use of anticoagulants: Secondary | ICD-10-CM

## 2012-05-13 DIAGNOSIS — F17200 Nicotine dependence, unspecified, uncomplicated: Secondary | ICD-10-CM

## 2012-05-13 DIAGNOSIS — K7689 Other specified diseases of liver: Secondary | ICD-10-CM

## 2012-05-13 DIAGNOSIS — I2699 Other pulmonary embolism without acute cor pulmonale: Secondary | ICD-10-CM

## 2012-05-13 DIAGNOSIS — I743 Embolism and thrombosis of arteries of the lower extremities: Secondary | ICD-10-CM

## 2012-05-13 DIAGNOSIS — Z720 Tobacco use: Secondary | ICD-10-CM

## 2012-05-13 DIAGNOSIS — D6859 Other primary thrombophilia: Secondary | ICD-10-CM

## 2012-05-13 DIAGNOSIS — D6862 Lupus anticoagulant syndrome: Secondary | ICD-10-CM

## 2012-05-13 DIAGNOSIS — Z0000 Encounter for general adult medical examination without abnormal findings: Secondary | ICD-10-CM

## 2012-05-13 LAB — POCT INR: INR: 2.2

## 2012-05-13 MED ORDER — WARFARIN SODIUM 5 MG PO TABS: ORAL_TABLET | ORAL | Status: DC

## 2012-05-13 NOTE — Progress Notes (Deleted)
Name: Andrew [name]    DOB: March 28, 1971  Age: 42 y.o.  MR#: [id number]       PCP:  No primary provider on file.      Insurance: @PAYORNAME @   CC:   No chief complaint on file.   VS BP 102/61  Pulse 80  Wt 212 lb (96.163 kg)  SpO2 97%  Weights Current Weight  05/13/12 212 lb (96.163 kg)  09/19/11 225 lb (102.059 kg)  08/05/11 225 lb (102.059 kg)    Blood Pressure  BP Readings from Last 3 Encounters:  05/13/12 102/61  09/19/11 115/79  08/05/11 110/73     Admit date:  (Not on file) Last encounter with RMR:  Visit date not found   Allergy No Known Allergies  Current Outpatient Prescriptions  Medication Sig Dispense Refill  . warfarin (COUMADIN) 5 MG tablet Take 1-1.5 tablets (5-7.5 mg total) by mouth See admin instructions.  45 tablet  3    Discontinued Meds:   There are no discontinued medications.  Patient Active Problem List  Diagnosis  . PULMONARY EMBOLISM  . Lupus anticoagulant disorder  . Chronic anticoagulation  . Tobacco abuse  . Hepatic steatosis    LABS Anti-coag visit on 03/26/2012  Component Date Value  . INR 03/26/2012 2.4      Results for this Opt Visit:     Results for orders placed in visit on 03/26/12  POCT INR      Component Value Range   INR 2.4      EKG Orders placed in visit on 09/15/09  . CONVERTED CEMR EKG     Prior Assessment and Plan Problem List as of 05/13/2012            Cardiology Problems   PULMONARY EMBOLISM   Last Assessment & Plan Note   04/04/2011 Office Visit Signed 04/04/2011  6:23 PM by Kathlen Brunswick, MD    No symptoms to suggest recurrent events or pulmonary hypertension.      Other   Lupus anticoagulant disorder   Chronic anticoagulation   Last Assessment & Plan Note   04/04/2011 Office Visit Signed 04/04/2011  6:23 PM by Kathlen Brunswick, MD    Normal CBC in 10/2010; Hemoccult testing of stools is pending.    Tobacco abuse   Hepatic steatosis       Imaging: No results found.   FRS  Calculation: Score not calculated. Missing: Total Cholesterol, HDL

## 2012-05-13 NOTE — Progress Notes (Signed)
   HPI: Andrew [name] is a 42 y/o patient of Dr.Rothbart we are following on annual appointment for continued assessment of history of PE and lupus anticoagulant requiring chronic anticoagulation. He does not have a PCP. He unfortunately continues to smoke 1ppd. He is followed in our office for coumadin dosing. He has had no hospitalizations,ER visits or new diagnosis since being seen last. He denies bleeding or excessive bruising. He is essentially asymptomatic.  No Known Allergies  Current Outpatient Prescriptions  Medication Sig Dispense Refill  . warfarin (COUMADIN) 5 MG tablet Take 1 tablet daily except 1 1/2 tablets on Tuesdays and Thursdays  45 tablet  3    Past Medical History  Diagnosis Date  . Pulmonary embolism     2006  . Hepatic steatosis 04/04/2011  . Chronic anticoagulation     Past Surgical History  Procedure Date  . Chest tube insertion     for pulmonary embolism  . Colonoscopy none    Not yet performed or indicated as of 2012    ZOX:WRUEAV of systems complete and found to be negative unless listed above PHYSICAL EXAM BP 102/61  Pulse 80  Wt 212 lb (96.163 kg)  SpO2 97%  General: Well developed, well nourished, in no acute distress Head: Eyes PERRLA, No xanthomas.   Normal cephalic and atramatic  Lungs: Clear bilaterally to auscultation and percussion. Heart: HRRR S1 S2, without MRG.  Pulses are 2+ & equal.            No carotid bruit. No JVD.  No abdominal bruits. No femoral bruits. Abdomen: Bowel sounds are positive, abdomen soft and non-tender without masses or                  Hernia's noted. Msk:  Back normal, normal gait. Normal strength and tone for age. Extremities: No clubbing, cyanosis or edema.  DP +1 Neuro: Alert and oriented X 3. Psych:  Good affect, responds appropriately  :  ASSESSMENT AND PLAN

## 2012-05-13 NOTE — Assessment & Plan Note (Signed)
Will check lipids and LFT;s as he has no PCP in the setting of lupus and ongoing tobacco abuse.

## 2012-05-13 NOTE — Assessment & Plan Note (Signed)
He has no plans to quit at this time. Smoking cessation is discussed.

## 2012-05-13 NOTE — Assessment & Plan Note (Signed)
Related to lupus anticoagulant syndrome. He will continue on coumadin therapy per Dr.Rothbart. He is asymptomatic for bleeding or melena.

## 2012-05-13 NOTE — Patient Instructions (Addendum)
Your physician recommends that you schedule a follow-up appointment in: 1 year  Your physician recommends that you return for lab work in: this week

## 2012-05-28 ENCOUNTER — Telehealth: Payer: Self-pay | Admitting: *Deleted

## 2012-05-28 ENCOUNTER — Encounter: Payer: Self-pay | Admitting: *Deleted

## 2012-05-28 DIAGNOSIS — I10 Essential (primary) hypertension: Secondary | ICD-10-CM

## 2012-05-28 DIAGNOSIS — E782 Mixed hyperlipidemia: Secondary | ICD-10-CM

## 2012-05-28 NOTE — Telephone Encounter (Signed)
Noted pt did not have his labs drawn when advised at last OV, mailed copy of lab slips for CMET,CBC,Lipids and letter to have drawn no later than 06-11-12, placed new reminder in chart to verify if pt has drawn.

## 2012-06-12 ENCOUNTER — Telehealth: Payer: Self-pay | Admitting: *Deleted

## 2012-06-12 NOTE — Telephone Encounter (Signed)
Noted pt still has not had labs drawn, slips given at OV

## 2012-07-01 ENCOUNTER — Ambulatory Visit (INDEPENDENT_AMBULATORY_CARE_PROVIDER_SITE_OTHER): Payer: Self-pay | Admitting: *Deleted

## 2012-07-01 ENCOUNTER — Encounter: Payer: Self-pay | Admitting: *Deleted

## 2012-07-01 DIAGNOSIS — I2699 Other pulmonary embolism without acute cor pulmonale: Secondary | ICD-10-CM

## 2012-07-01 DIAGNOSIS — I743 Embolism and thrombosis of arteries of the lower extremities: Secondary | ICD-10-CM

## 2012-07-01 DIAGNOSIS — Z7901 Long term (current) use of anticoagulants: Secondary | ICD-10-CM

## 2012-07-01 DIAGNOSIS — D6862 Lupus anticoagulant syndrome: Secondary | ICD-10-CM

## 2012-07-01 LAB — POCT INR: INR: 1.4

## 2012-07-20 ENCOUNTER — Ambulatory Visit (INDEPENDENT_AMBULATORY_CARE_PROVIDER_SITE_OTHER): Payer: Self-pay | Admitting: *Deleted

## 2012-07-20 DIAGNOSIS — D6862 Lupus anticoagulant syndrome: Secondary | ICD-10-CM

## 2012-07-20 DIAGNOSIS — I743 Embolism and thrombosis of arteries of the lower extremities: Secondary | ICD-10-CM

## 2012-07-20 DIAGNOSIS — I2699 Other pulmonary embolism without acute cor pulmonale: Secondary | ICD-10-CM

## 2012-07-20 DIAGNOSIS — Z7901 Long term (current) use of anticoagulants: Secondary | ICD-10-CM

## 2012-07-20 LAB — POCT INR: INR: 2.9

## 2012-08-20 ENCOUNTER — Ambulatory Visit (INDEPENDENT_AMBULATORY_CARE_PROVIDER_SITE_OTHER): Payer: Self-pay | Admitting: *Deleted

## 2012-08-20 DIAGNOSIS — D6862 Lupus anticoagulant syndrome: Secondary | ICD-10-CM

## 2012-08-20 DIAGNOSIS — I2699 Other pulmonary embolism without acute cor pulmonale: Secondary | ICD-10-CM

## 2012-08-20 DIAGNOSIS — I743 Embolism and thrombosis of arteries of the lower extremities: Secondary | ICD-10-CM

## 2012-08-20 DIAGNOSIS — D6859 Other primary thrombophilia: Secondary | ICD-10-CM

## 2012-08-20 DIAGNOSIS — Z7901 Long term (current) use of anticoagulants: Secondary | ICD-10-CM

## 2012-09-15 ENCOUNTER — Other Ambulatory Visit: Payer: Self-pay | Admitting: Cardiology

## 2012-09-15 NOTE — Telephone Encounter (Signed)
Please refill.

## 2012-09-17 ENCOUNTER — Ambulatory Visit (INDEPENDENT_AMBULATORY_CARE_PROVIDER_SITE_OTHER): Payer: Self-pay | Admitting: *Deleted

## 2012-09-17 DIAGNOSIS — D6859 Other primary thrombophilia: Secondary | ICD-10-CM

## 2012-09-17 DIAGNOSIS — D6862 Lupus anticoagulant syndrome: Secondary | ICD-10-CM

## 2012-09-17 DIAGNOSIS — Z7901 Long term (current) use of anticoagulants: Secondary | ICD-10-CM

## 2012-09-17 DIAGNOSIS — I2699 Other pulmonary embolism without acute cor pulmonale: Secondary | ICD-10-CM

## 2012-09-17 DIAGNOSIS — I743 Embolism and thrombosis of arteries of the lower extremities: Secondary | ICD-10-CM

## 2012-10-08 ENCOUNTER — Ambulatory Visit (INDEPENDENT_AMBULATORY_CARE_PROVIDER_SITE_OTHER): Payer: Self-pay | Admitting: *Deleted

## 2012-10-08 ENCOUNTER — Ambulatory Visit: Payer: Self-pay | Admitting: Cardiology

## 2012-10-08 DIAGNOSIS — Z7901 Long term (current) use of anticoagulants: Secondary | ICD-10-CM

## 2012-10-08 DIAGNOSIS — D6859 Other primary thrombophilia: Secondary | ICD-10-CM

## 2012-10-08 DIAGNOSIS — I743 Embolism and thrombosis of arteries of the lower extremities: Secondary | ICD-10-CM

## 2012-10-08 DIAGNOSIS — D6862 Lupus anticoagulant syndrome: Secondary | ICD-10-CM

## 2012-10-08 DIAGNOSIS — I2699 Other pulmonary embolism without acute cor pulmonale: Secondary | ICD-10-CM

## 2012-10-08 LAB — POCT INR: INR: 2.1

## 2012-11-25 ENCOUNTER — Ambulatory Visit (INDEPENDENT_AMBULATORY_CARE_PROVIDER_SITE_OTHER): Payer: Self-pay | Admitting: *Deleted

## 2012-11-25 DIAGNOSIS — I743 Embolism and thrombosis of arteries of the lower extremities: Secondary | ICD-10-CM

## 2012-11-25 DIAGNOSIS — D6862 Lupus anticoagulant syndrome: Secondary | ICD-10-CM

## 2012-11-25 DIAGNOSIS — D6859 Other primary thrombophilia: Secondary | ICD-10-CM

## 2012-11-25 DIAGNOSIS — I2699 Other pulmonary embolism without acute cor pulmonale: Secondary | ICD-10-CM

## 2012-11-25 DIAGNOSIS — Z7901 Long term (current) use of anticoagulants: Secondary | ICD-10-CM

## 2013-01-18 ENCOUNTER — Ambulatory Visit (INDEPENDENT_AMBULATORY_CARE_PROVIDER_SITE_OTHER): Payer: Self-pay | Admitting: *Deleted

## 2013-01-18 DIAGNOSIS — I2699 Other pulmonary embolism without acute cor pulmonale: Secondary | ICD-10-CM

## 2013-01-18 DIAGNOSIS — D6859 Other primary thrombophilia: Secondary | ICD-10-CM

## 2013-01-18 DIAGNOSIS — I743 Embolism and thrombosis of arteries of the lower extremities: Secondary | ICD-10-CM

## 2013-01-18 DIAGNOSIS — Z7901 Long term (current) use of anticoagulants: Secondary | ICD-10-CM

## 2013-01-18 DIAGNOSIS — D6862 Lupus anticoagulant syndrome: Secondary | ICD-10-CM

## 2013-03-01 ENCOUNTER — Ambulatory Visit (INDEPENDENT_AMBULATORY_CARE_PROVIDER_SITE_OTHER): Payer: Self-pay | Admitting: *Deleted

## 2013-03-01 DIAGNOSIS — I743 Embolism and thrombosis of arteries of the lower extremities: Secondary | ICD-10-CM

## 2013-03-01 DIAGNOSIS — D6862 Lupus anticoagulant syndrome: Secondary | ICD-10-CM

## 2013-03-01 DIAGNOSIS — I2699 Other pulmonary embolism without acute cor pulmonale: Secondary | ICD-10-CM

## 2013-03-01 DIAGNOSIS — D6859 Other primary thrombophilia: Secondary | ICD-10-CM

## 2013-03-01 DIAGNOSIS — Z7901 Long term (current) use of anticoagulants: Secondary | ICD-10-CM

## 2013-03-01 LAB — POCT INR: INR: 2.7

## 2013-03-07 ENCOUNTER — Other Ambulatory Visit: Payer: Self-pay | Admitting: Cardiology

## 2013-04-12 ENCOUNTER — Ambulatory Visit (INDEPENDENT_AMBULATORY_CARE_PROVIDER_SITE_OTHER): Payer: Self-pay | Admitting: *Deleted

## 2013-04-12 DIAGNOSIS — Z7901 Long term (current) use of anticoagulants: Secondary | ICD-10-CM

## 2013-04-12 DIAGNOSIS — I2699 Other pulmonary embolism without acute cor pulmonale: Secondary | ICD-10-CM

## 2013-04-12 DIAGNOSIS — D6859 Other primary thrombophilia: Secondary | ICD-10-CM

## 2013-04-12 DIAGNOSIS — D6862 Lupus anticoagulant syndrome: Secondary | ICD-10-CM

## 2013-04-12 DIAGNOSIS — I743 Embolism and thrombosis of arteries of the lower extremities: Secondary | ICD-10-CM

## 2013-04-12 LAB — POCT INR: INR: 2.2

## 2013-05-24 ENCOUNTER — Encounter: Payer: Self-pay | Admitting: *Deleted

## 2013-05-28 ENCOUNTER — Encounter: Payer: Self-pay | Admitting: Cardiology

## 2013-05-28 ENCOUNTER — Ambulatory Visit (INDEPENDENT_AMBULATORY_CARE_PROVIDER_SITE_OTHER): Payer: Self-pay | Admitting: Cardiology

## 2013-05-28 VITALS — BP 127/84 | HR 71 | Ht 72.0 in | Wt 196.0 lb

## 2013-05-28 DIAGNOSIS — I10 Essential (primary) hypertension: Secondary | ICD-10-CM

## 2013-05-28 DIAGNOSIS — I2699 Other pulmonary embolism without acute cor pulmonale: Secondary | ICD-10-CM

## 2013-05-28 NOTE — Progress Notes (Signed)
Clinical Summary Mr. Cleotis LemaSteed is a 43 y.o.male last seen by NP Lyman BishopLawrence, this is our first visit together. She is seen for the following medical problems.  1. Pulmonary embolism - history of prior pulmonary embolism - history of lupus anticoagulant, on long term anticoagulation - compliant with coumadin, no issues with bleeding.  - denies any recent chest pain or SOB.   Past Medical History  Diagnosis Date  . Pulmonary embolism     2006  . Hepatic steatosis 04/04/2011  . Chronic anticoagulation      No Known Allergies   Current Outpatient Prescriptions  Medication Sig Dispense Refill  . warfarin (COUMADIN) 5 MG tablet Take 1 tablet daily except 1 1/2 tablets on Tuesdays and Thursdays  45 tablet  3  . warfarin (COUMADIN) 5 MG tablet TAKE ONE TO ONE & ONE-HALF TABLETS BY MOUTH AS DIRECTED --**PATIENT NEEDS TO SCHEDULE APPOINTMENT WITH DR. Dietrich PatesOTHBART**  45 tablet  3  . warfarin (COUMADIN) 5 MG tablet TAKE ONE TO ONE & ONE-HALF TABLETS BY MOUTH AS DIRECTED --**PATIENT NEEDS TO SCHEDULE APPOINTMENT WITH DR. Dietrich PatesOTHBART**  45 tablet  3   No current facility-administered medications for this visit.     Past Surgical History  Procedure Laterality Date  . Chest tube insertion      for pulmonary embolism  . Colonoscopy  none    Not yet performed or indicated as of 2012     No Known Allergies    No family history on file.   Social History Mr. Cleotis LemaSteed reports that he has been smoking Cigarettes.  He has been smoking about 1.00 pack per day. He does not have any smokeless tobacco history on file. Mr. Cleotis LemaSteed reports that he does not drink alcohol.   Review of Systems CONSTITUTIONAL: No weight loss, fever, chills, weakness or fatigue.  HEENT: Eyes: No visual loss, blurred vision, double vision or yellow sclerae.No hearing loss, sneezing, congestion, runny nose or sore throat.  SKIN: No rash or itching.  CARDIOVASCULAR: no chest pain, no palpitations, no orthopnea, no  PND RESPIRATORY: No shortness of breath, cough or sputum.  GASTROINTESTINAL: No anorexia, nausea, vomiting or diarrhea. No abdominal pain or blood.  GENITOURINARY: No burning on urination, no polyuria NEUROLOGICAL: No headache, dizziness, syncope, paralysis, ataxia, numbness or tingling in the extremities. No change in bowel or bladder control.  MUSCULOSKELETAL: No muscle, back pain, joint pain or stiffness.  LYMPHATICS: No enlarged nodes. No history of splenectomy.  PSYCHIATRIC: No history of depression or anxiety.  ENDOCRINOLOGIC: No reports of sweating, cold or heat intolerance. No polyuria or polydipsia.  Marland Kitchen.   Physical Examination p 71  bp 127/84 Wt 196 lbs BMI 27 Gen: resting comfortably, no acute distress HEENT: no scleral icterus, pupils equal round and reactive, no palptable cervical adenopathy,  CV: RRR, no m/r/g, no JVD, no carotid bruits Resp: Clear to auscultation bilaterally GI: abdomen is soft, non-tender, non-distended, normal bowel sounds, no hepatosplenomegaly MSK: extremities are warm, no edema.  Skin: warm, no rash Neuro:  no focal deficits Psych: appropriate affect   Diagnostic Studies 05/27/13 Clinic EKG Sinus rhythm    Assessment and Plan   1. PE - no recent symptoms, compliant with coumadin - continue lifelong anticoagulation given diagnosis of lupus anticoagulant - discussed newer oral anticoagulants. He is concerned given the cost, he currently does not have insurance. Also concerned that he works [employer] and if he were to have an injury there is no anecdote. Comfortable continuing coumadin for  now.   F/u 1 year.    Antoine Poche, M.D., F.A.C.C.

## 2013-05-28 NOTE — Patient Instructions (Signed)
Your physician wants you to follow-up in: ONE YEAR You will receive a reminder letter in the mail two months in advance. If you don't receive a letter, please call our office to schedule the follow-up appointment.  

## 2013-07-08 ENCOUNTER — Ambulatory Visit (INDEPENDENT_AMBULATORY_CARE_PROVIDER_SITE_OTHER): Payer: Self-pay | Admitting: *Deleted

## 2013-07-08 DIAGNOSIS — D6862 Lupus anticoagulant syndrome: Secondary | ICD-10-CM

## 2013-07-08 DIAGNOSIS — Z5181 Encounter for therapeutic drug level monitoring: Secondary | ICD-10-CM | POA: Insufficient documentation

## 2013-07-08 DIAGNOSIS — I2699 Other pulmonary embolism without acute cor pulmonale: Secondary | ICD-10-CM

## 2013-07-08 DIAGNOSIS — Z7901 Long term (current) use of anticoagulants: Secondary | ICD-10-CM

## 2013-07-08 DIAGNOSIS — D6859 Other primary thrombophilia: Secondary | ICD-10-CM

## 2013-07-08 LAB — POCT INR: INR: 2

## 2013-08-16 ENCOUNTER — Emergency Department (HOSPITAL_COMMUNITY)
Admission: EM | Admit: 2013-08-16 | Discharge: 2013-08-16 | Discharge status: Against Medical Advice | Payer: Self-pay | Attending: Emergency Medicine | Admitting: Emergency Medicine

## 2013-08-16 ENCOUNTER — Encounter (HOSPITAL_COMMUNITY): Payer: Self-pay | Admitting: Emergency Medicine

## 2013-08-16 DIAGNOSIS — R42 Dizziness and giddiness: Secondary | ICD-10-CM | POA: Insufficient documentation

## 2013-08-16 DIAGNOSIS — R0602 Shortness of breath: Secondary | ICD-10-CM | POA: Insufficient documentation

## 2013-08-16 DIAGNOSIS — F17200 Nicotine dependence, unspecified, uncomplicated: Secondary | ICD-10-CM | POA: Insufficient documentation

## 2013-08-16 NOTE — ED Notes (Signed)
Called patient from waiting room with no answer at 1913 and 1945.

## 2013-08-16 NOTE — ED Notes (Signed)
Sudden onset of dizziness and sob today.

## 2013-08-19 ENCOUNTER — Ambulatory Visit (INDEPENDENT_AMBULATORY_CARE_PROVIDER_SITE_OTHER): Payer: Self-pay | Admitting: *Deleted

## 2013-08-19 DIAGNOSIS — Z5181 Encounter for therapeutic drug level monitoring: Secondary | ICD-10-CM

## 2013-08-19 DIAGNOSIS — Z7901 Long term (current) use of anticoagulants: Secondary | ICD-10-CM

## 2013-08-19 DIAGNOSIS — D6859 Other primary thrombophilia: Secondary | ICD-10-CM

## 2013-08-19 DIAGNOSIS — D6862 Lupus anticoagulant syndrome: Secondary | ICD-10-CM

## 2013-08-19 DIAGNOSIS — I2699 Other pulmonary embolism without acute cor pulmonale: Secondary | ICD-10-CM

## 2013-08-19 LAB — POCT INR: INR: 2.5

## 2013-09-09 ENCOUNTER — Other Ambulatory Visit: Payer: Self-pay | Admitting: Adult Health

## 2013-09-30 ENCOUNTER — Encounter: Payer: Self-pay | Admitting: *Deleted

## 2013-10-13 ENCOUNTER — Ambulatory Visit (INDEPENDENT_AMBULATORY_CARE_PROVIDER_SITE_OTHER): Payer: BC Managed Care – PPO | Admitting: *Deleted

## 2013-10-13 DIAGNOSIS — Z7901 Long term (current) use of anticoagulants: Secondary | ICD-10-CM

## 2013-10-13 DIAGNOSIS — Z5181 Encounter for therapeutic drug level monitoring: Secondary | ICD-10-CM

## 2013-10-13 DIAGNOSIS — D6862 Lupus anticoagulant syndrome: Secondary | ICD-10-CM

## 2013-10-13 DIAGNOSIS — I2699 Other pulmonary embolism without acute cor pulmonale: Secondary | ICD-10-CM

## 2013-10-13 DIAGNOSIS — D6859 Other primary thrombophilia: Secondary | ICD-10-CM

## 2013-10-13 LAB — POCT INR: INR: 3.8

## 2013-11-03 ENCOUNTER — Ambulatory Visit (INDEPENDENT_AMBULATORY_CARE_PROVIDER_SITE_OTHER): Payer: BC Managed Care – PPO | Admitting: *Deleted

## 2013-11-03 DIAGNOSIS — D6862 Lupus anticoagulant syndrome: Secondary | ICD-10-CM

## 2013-11-03 DIAGNOSIS — Z5181 Encounter for therapeutic drug level monitoring: Secondary | ICD-10-CM

## 2013-11-03 DIAGNOSIS — Z7901 Long term (current) use of anticoagulants: Secondary | ICD-10-CM

## 2013-11-03 DIAGNOSIS — D6859 Other primary thrombophilia: Secondary | ICD-10-CM

## 2013-11-03 DIAGNOSIS — I2699 Other pulmonary embolism without acute cor pulmonale: Secondary | ICD-10-CM

## 2013-11-03 LAB — POCT INR: INR: 2.1

## 2013-12-01 ENCOUNTER — Ambulatory Visit (INDEPENDENT_AMBULATORY_CARE_PROVIDER_SITE_OTHER): Payer: BC Managed Care – PPO | Admitting: *Deleted

## 2013-12-01 DIAGNOSIS — Z5181 Encounter for therapeutic drug level monitoring: Secondary | ICD-10-CM

## 2013-12-01 DIAGNOSIS — I2699 Other pulmonary embolism without acute cor pulmonale: Secondary | ICD-10-CM

## 2013-12-01 DIAGNOSIS — D6862 Lupus anticoagulant syndrome: Secondary | ICD-10-CM

## 2013-12-01 DIAGNOSIS — Z7901 Long term (current) use of anticoagulants: Secondary | ICD-10-CM

## 2013-12-01 DIAGNOSIS — D6859 Other primary thrombophilia: Secondary | ICD-10-CM

## 2013-12-01 LAB — POCT INR: INR: 3.6

## 2014-01-27 ENCOUNTER — Encounter: Payer: Self-pay | Admitting: *Deleted

## 2014-02-09 ENCOUNTER — Ambulatory Visit (INDEPENDENT_AMBULATORY_CARE_PROVIDER_SITE_OTHER): Payer: BC Managed Care – PPO | Admitting: *Deleted

## 2014-02-09 DIAGNOSIS — I2699 Other pulmonary embolism without acute cor pulmonale: Secondary | ICD-10-CM

## 2014-02-09 DIAGNOSIS — Z7901 Long term (current) use of anticoagulants: Secondary | ICD-10-CM

## 2014-02-09 DIAGNOSIS — D6862 Lupus anticoagulant syndrome: Secondary | ICD-10-CM

## 2014-02-09 DIAGNOSIS — Z5181 Encounter for therapeutic drug level monitoring: Secondary | ICD-10-CM

## 2014-02-09 LAB — POCT INR: INR: 2.8

## 2014-02-09 MED ORDER — WARFARIN SODIUM 5 MG PO TABS: ORAL_TABLET | ORAL | Status: DC

## 2014-03-23 ENCOUNTER — Ambulatory Visit (INDEPENDENT_AMBULATORY_CARE_PROVIDER_SITE_OTHER): Payer: BC Managed Care – PPO | Admitting: *Deleted

## 2014-03-23 DIAGNOSIS — D6862 Lupus anticoagulant syndrome: Secondary | ICD-10-CM

## 2014-03-23 DIAGNOSIS — I2699 Other pulmonary embolism without acute cor pulmonale: Secondary | ICD-10-CM

## 2014-03-23 DIAGNOSIS — Z5181 Encounter for therapeutic drug level monitoring: Secondary | ICD-10-CM

## 2014-03-23 DIAGNOSIS — Z7901 Long term (current) use of anticoagulants: Secondary | ICD-10-CM

## 2014-03-23 LAB — POCT INR: INR: 2.2

## 2014-05-04 ENCOUNTER — Ambulatory Visit (INDEPENDENT_AMBULATORY_CARE_PROVIDER_SITE_OTHER): Payer: BC Managed Care – PPO | Admitting: *Deleted

## 2014-05-04 DIAGNOSIS — I2699 Other pulmonary embolism without acute cor pulmonale: Secondary | ICD-10-CM

## 2014-05-04 DIAGNOSIS — D6862 Lupus anticoagulant syndrome: Secondary | ICD-10-CM

## 2014-05-04 DIAGNOSIS — Z5181 Encounter for therapeutic drug level monitoring: Secondary | ICD-10-CM

## 2014-05-04 DIAGNOSIS — Z7901 Long term (current) use of anticoagulants: Secondary | ICD-10-CM

## 2014-05-04 LAB — POCT INR: INR: 2.5

## 2014-06-07 ENCOUNTER — Telehealth: Payer: Self-pay | Admitting: Cardiology

## 2014-06-07 MED ORDER — WARFARIN SODIUM 5 MG PO TABS: ORAL_TABLET | ORAL | Status: DC

## 2014-06-07 NOTE — Telephone Encounter (Signed)
[facility]

## 2014-06-15 ENCOUNTER — Ambulatory Visit (INDEPENDENT_AMBULATORY_CARE_PROVIDER_SITE_OTHER): Payer: Self-pay | Admitting: *Deleted

## 2014-06-15 DIAGNOSIS — Z5181 Encounter for therapeutic drug level monitoring: Secondary | ICD-10-CM

## 2014-06-15 DIAGNOSIS — Z7901 Long term (current) use of anticoagulants: Secondary | ICD-10-CM

## 2014-06-15 DIAGNOSIS — I2699 Other pulmonary embolism without acute cor pulmonale: Secondary | ICD-10-CM

## 2014-06-15 DIAGNOSIS — D6862 Lupus anticoagulant syndrome: Secondary | ICD-10-CM

## 2014-06-15 LAB — POCT INR: INR: 2.6

## 2014-08-03 ENCOUNTER — Ambulatory Visit (INDEPENDENT_AMBULATORY_CARE_PROVIDER_SITE_OTHER): Payer: Self-pay | Admitting: Cardiology

## 2014-08-03 ENCOUNTER — Encounter: Payer: Self-pay | Admitting: Cardiology

## 2014-08-03 ENCOUNTER — Ambulatory Visit (INDEPENDENT_AMBULATORY_CARE_PROVIDER_SITE_OTHER): Payer: Self-pay | Admitting: *Deleted

## 2014-08-03 VITALS — BP 98/62 | HR 68 | Ht 72.0 in | Wt 174.2 lb

## 2014-08-03 DIAGNOSIS — I2699 Other pulmonary embolism without acute cor pulmonale: Secondary | ICD-10-CM

## 2014-08-03 DIAGNOSIS — Z5181 Encounter for therapeutic drug level monitoring: Secondary | ICD-10-CM

## 2014-08-03 DIAGNOSIS — Z7901 Long term (current) use of anticoagulants: Secondary | ICD-10-CM

## 2014-08-03 DIAGNOSIS — D6862 Lupus anticoagulant syndrome: Secondary | ICD-10-CM

## 2014-08-03 DIAGNOSIS — Z136 Encounter for screening for cardiovascular disorders: Secondary | ICD-10-CM

## 2014-08-03 LAB — POCT INR: INR: 2

## 2014-08-03 NOTE — Progress Notes (Signed)
     Clinical Summary [name] is a 44 y.o.male seen today for follow up of the following medical problems.   1. Pulmonary embolism - history of prior pulmonary embolism - history of lupus anticoagulant, on long term anticoagulation.  - denies any recent chest pain or SOB. - no bleeding troubles on coumadin. Has not been interested in NOACs due to cost   Past Medical History  Diagnosis Date  . Pulmonary embolism     2006  . Hepatic steatosis 04/04/2011  . Chronic anticoagulation      No Known Allergies   Current Outpatient Prescriptions  Medication Sig Dispense Refill  . warfarin (COUMADIN) 5 MG tablet Take 1 tablet daily except 1 1/2 tablets on Tuesdays and Thursdays or as direcetd 40 tablet 3   No current facility-administered medications for this visit.     Past Surgical History  Procedure Laterality Date  . Chest tube insertion      for pulmonary embolism  . Colonoscopy  none    Not yet performed or indicated as of 2012     No Known Allergies    No family history on file.   Social History [name] reports that he has been smoking Cigarettes.  He has been smoking about 1.00 pack per day. He does not have any smokeless tobacco history on file. [name] reports that he does not drink alcohol.   Review of Systems CONSTITUTIONAL: No weight loss, fever, chills, weakness or fatigue.  HEENT: Eyes: No visual loss, blurred vision, double vision or yellow sclerae.No hearing loss, sneezing, congestion, runny nose or sore throat.  SKIN: No rash or itching.  CARDIOVASCULAR: per HPI RESPIRATORY: No shortness of breath, cough or sputum.  GASTROINTESTINAL: No anorexia, nausea, vomiting or diarrhea. No abdominal pain or blood.  GENITOURINARY: No burning on urination, no polyuria NEUROLOGICAL: No headache, dizziness, syncope, paralysis, ataxia, numbness or tingling in the extremities. No change in bowel or bladder control.  MUSCULOSKELETAL: No muscle, back pain,  joint pain or stiffness.  LYMPHATICS: No enlarged nodes. No history of splenectomy.  PSYCHIATRIC: No history of depression or anxiety.  ENDOCRINOLOGIC: No reports of sweating, cold or heat intolerance. No polyuria or polydipsia.  Marland Kitchen.   Physical Examination There were no vitals filed for this visit. There were no vitals filed for this visit.  Gen: resting comfortably, no acute distress HEENT: no scleral icterus, pupils equal round and reactive, no palptable cervical adenopathy,  CV Resp: Clear to auscultation bilaterally GI: abdomen is soft, non-tender, non-distended, normal bowel sounds, no hepatosplenomegaly MSK: extremities are warm, no edema.  Skin: warm, no rash Neuro:  no focal deficits Psych: appropriate affect   Diagnostic Studies     Assessment and Plan   1. PE - no recent symptoms, compliant with coumadin - continue lifelong anticoagulation given diagnosis of lupus anticoagulant   F/u 1 year.      Andrew [name]

## 2014-08-03 NOTE — Patient Instructions (Signed)
Your physician wants you to follow-up in: 1 year with Dr. Branch. You will receive a reminder letter in the mail two months in advance. If you don't receive a letter, please call our office to schedule the follow-up appointment.   Your physician recommends that you continue on your current medications as directed. Please refer to the Current Medication list given to you today.  Your physician recommends that you return for lab work in: Fasting   Thank you for choosing [facility]!    

## 2014-09-14 ENCOUNTER — Ambulatory Visit (INDEPENDENT_AMBULATORY_CARE_PROVIDER_SITE_OTHER): Payer: Self-pay | Admitting: *Deleted

## 2014-09-14 DIAGNOSIS — Z7901 Long term (current) use of anticoagulants: Secondary | ICD-10-CM

## 2014-09-14 DIAGNOSIS — Z5181 Encounter for therapeutic drug level monitoring: Secondary | ICD-10-CM

## 2014-09-14 DIAGNOSIS — I2699 Other pulmonary embolism without acute cor pulmonale: Secondary | ICD-10-CM

## 2014-09-14 DIAGNOSIS — D6862 Lupus anticoagulant syndrome: Secondary | ICD-10-CM

## 2014-09-14 LAB — POCT INR: INR: 1.9

## 2014-10-17 ENCOUNTER — Ambulatory Visit (INDEPENDENT_AMBULATORY_CARE_PROVIDER_SITE_OTHER): Payer: Self-pay | Admitting: *Deleted

## 2014-10-17 DIAGNOSIS — Z5181 Encounter for therapeutic drug level monitoring: Secondary | ICD-10-CM

## 2014-10-17 DIAGNOSIS — I2699 Other pulmonary embolism without acute cor pulmonale: Secondary | ICD-10-CM

## 2014-10-17 DIAGNOSIS — D6862 Lupus anticoagulant syndrome: Secondary | ICD-10-CM

## 2014-10-17 DIAGNOSIS — Z7901 Long term (current) use of anticoagulants: Secondary | ICD-10-CM

## 2014-10-17 LAB — POCT INR: INR: 2.9

## 2014-10-28 ENCOUNTER — Telehealth: Payer: Self-pay

## 2014-10-28 MED ORDER — WARFARIN SODIUM 5 MG PO TABS: ORAL_TABLET | ORAL | Status: DC

## 2014-10-28 NOTE — Telephone Encounter (Signed)
Refilled rx

## 2014-11-23 ENCOUNTER — Ambulatory Visit (INDEPENDENT_AMBULATORY_CARE_PROVIDER_SITE_OTHER): Payer: Self-pay | Admitting: *Deleted

## 2014-11-23 DIAGNOSIS — Z5181 Encounter for therapeutic drug level monitoring: Secondary | ICD-10-CM

## 2014-11-23 DIAGNOSIS — D6862 Lupus anticoagulant syndrome: Secondary | ICD-10-CM

## 2014-11-23 DIAGNOSIS — I2699 Other pulmonary embolism without acute cor pulmonale: Secondary | ICD-10-CM

## 2014-11-23 DIAGNOSIS — Z7901 Long term (current) use of anticoagulants: Secondary | ICD-10-CM

## 2014-11-23 LAB — POCT INR: INR: 2

## 2014-12-29 ENCOUNTER — Ambulatory Visit (INDEPENDENT_AMBULATORY_CARE_PROVIDER_SITE_OTHER): Payer: Self-pay | Admitting: *Deleted

## 2014-12-29 DIAGNOSIS — I2699 Other pulmonary embolism without acute cor pulmonale: Secondary | ICD-10-CM

## 2014-12-29 DIAGNOSIS — Z5181 Encounter for therapeutic drug level monitoring: Secondary | ICD-10-CM

## 2014-12-29 DIAGNOSIS — Z7901 Long term (current) use of anticoagulants: Secondary | ICD-10-CM

## 2014-12-29 DIAGNOSIS — D6862 Lupus anticoagulant syndrome: Secondary | ICD-10-CM

## 2014-12-29 LAB — POCT INR: INR: 2.8

## 2015-02-13 ENCOUNTER — Ambulatory Visit (INDEPENDENT_AMBULATORY_CARE_PROVIDER_SITE_OTHER): Payer: Self-pay | Admitting: *Deleted

## 2015-02-13 DIAGNOSIS — I2699 Other pulmonary embolism without acute cor pulmonale: Secondary | ICD-10-CM

## 2015-02-13 DIAGNOSIS — Z7901 Long term (current) use of anticoagulants: Secondary | ICD-10-CM

## 2015-02-13 DIAGNOSIS — D6862 Lupus anticoagulant syndrome: Secondary | ICD-10-CM

## 2015-02-13 DIAGNOSIS — Z5181 Encounter for therapeutic drug level monitoring: Secondary | ICD-10-CM

## 2015-02-13 LAB — POCT INR: INR: 1.7

## 2015-03-12 ENCOUNTER — Other Ambulatory Visit: Payer: Self-pay | Admitting: Cardiology

## 2015-03-20 ENCOUNTER — Ambulatory Visit (INDEPENDENT_AMBULATORY_CARE_PROVIDER_SITE_OTHER): Payer: Self-pay | Admitting: *Deleted

## 2015-03-20 DIAGNOSIS — D6862 Lupus anticoagulant syndrome: Secondary | ICD-10-CM

## 2015-03-20 DIAGNOSIS — Z5181 Encounter for therapeutic drug level monitoring: Secondary | ICD-10-CM

## 2015-03-20 DIAGNOSIS — Z7901 Long term (current) use of anticoagulants: Secondary | ICD-10-CM

## 2015-03-20 DIAGNOSIS — I2699 Other pulmonary embolism without acute cor pulmonale: Secondary | ICD-10-CM

## 2015-03-20 LAB — POCT INR: INR: 1.7

## 2015-03-23 ENCOUNTER — Telehealth: Payer: Self-pay | Admitting: Cardiology

## 2015-03-23 NOTE — Telephone Encounter (Signed)
Pt is needing to have dental work done and is wondering how long he needs to go off his blood thinner

## 2015-03-23 NOTE — Telephone Encounter (Signed)
Pt. Having one tooth pulled. And would like to know how long to stop coumadin.

## 2015-03-23 NOTE — Telephone Encounter (Signed)
Patient will need dental surgery. 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 He will need to be bridged with lovenox. Misty StanleyLisa please arrange.   Dominga FerryJ Maryama Kuriakose MD

## 2015-03-24 NOTE — Telephone Encounter (Signed)
Spoke with family member who states that she would like to know how long the pt will have to be off coumadin. Given Dr. Verna CzechBranch's instructions.

## 2015-03-27 NOTE — Telephone Encounter (Signed)
Spoke with fiance.  Gave her below instructions.  She will let pt know.  Too call back if he has other questions.

## 2015-03-27 NOTE — Telephone Encounter (Signed)
If only one tooth and a simple extraction, would prefer that he remain on Coumadin given his risk and need for Lovenox.  If it is going to be an extensive procedure or multiple teeth, would need to hold 3-5 days prior.

## 2015-04-12 ENCOUNTER — Ambulatory Visit (INDEPENDENT_AMBULATORY_CARE_PROVIDER_SITE_OTHER): Payer: Self-pay | Admitting: *Deleted

## 2015-04-12 DIAGNOSIS — D6862 Lupus anticoagulant syndrome: Secondary | ICD-10-CM

## 2015-04-12 DIAGNOSIS — I2699 Other pulmonary embolism without acute cor pulmonale: Secondary | ICD-10-CM

## 2015-04-12 DIAGNOSIS — Z7901 Long term (current) use of anticoagulants: Secondary | ICD-10-CM

## 2015-04-12 DIAGNOSIS — Z5181 Encounter for therapeutic drug level monitoring: Secondary | ICD-10-CM

## 2015-04-12 LAB — POCT INR: INR: 3.1

## 2015-05-10 ENCOUNTER — Ambulatory Visit (INDEPENDENT_AMBULATORY_CARE_PROVIDER_SITE_OTHER): Payer: Self-pay | Admitting: *Deleted

## 2015-05-10 DIAGNOSIS — I2699 Other pulmonary embolism without acute cor pulmonale: Secondary | ICD-10-CM

## 2015-05-10 DIAGNOSIS — Z7901 Long term (current) use of anticoagulants: Secondary | ICD-10-CM

## 2015-05-10 DIAGNOSIS — D6862 Lupus anticoagulant syndrome: Secondary | ICD-10-CM

## 2015-05-10 DIAGNOSIS — Z5181 Encounter for therapeutic drug level monitoring: Secondary | ICD-10-CM

## 2015-05-10 LAB — POCT INR: INR: 2.5

## 2015-06-26 ENCOUNTER — Ambulatory Visit (INDEPENDENT_AMBULATORY_CARE_PROVIDER_SITE_OTHER): Payer: Self-pay | Admitting: *Deleted

## 2015-06-26 DIAGNOSIS — Z5181 Encounter for therapeutic drug level monitoring: Secondary | ICD-10-CM

## 2015-06-26 DIAGNOSIS — I2699 Other pulmonary embolism without acute cor pulmonale: Secondary | ICD-10-CM

## 2015-06-26 DIAGNOSIS — D6862 Lupus anticoagulant syndrome: Secondary | ICD-10-CM

## 2015-06-26 DIAGNOSIS — Z7901 Long term (current) use of anticoagulants: Secondary | ICD-10-CM

## 2015-06-26 LAB — POCT INR: INR: 2.9

## 2015-08-09 ENCOUNTER — Ambulatory Visit (INDEPENDENT_AMBULATORY_CARE_PROVIDER_SITE_OTHER): Payer: Self-pay | Admitting: *Deleted

## 2015-08-09 DIAGNOSIS — D6862 Lupus anticoagulant syndrome: Secondary | ICD-10-CM

## 2015-08-09 DIAGNOSIS — I2699 Other pulmonary embolism without acute cor pulmonale: Secondary | ICD-10-CM

## 2015-08-09 DIAGNOSIS — Z7901 Long term (current) use of anticoagulants: Secondary | ICD-10-CM

## 2015-08-09 DIAGNOSIS — Z5181 Encounter for therapeutic drug level monitoring: Secondary | ICD-10-CM

## 2015-08-09 LAB — POCT INR: INR: 2.5

## 2015-09-07 ENCOUNTER — Other Ambulatory Visit: Payer: Self-pay

## 2015-09-07 MED ORDER — WARFARIN SODIUM 5 MG PO TABS: ORAL_TABLET | ORAL | Status: DC

## 2015-09-07 NOTE — Telephone Encounter (Signed)
Refill complete 

## 2015-09-11 ENCOUNTER — Other Ambulatory Visit: Payer: Self-pay | Admitting: *Deleted

## 2015-09-11 MED ORDER — WARFARIN SODIUM 5 MG PO TABS: ORAL_TABLET | ORAL | Status: DC

## 2015-09-20 ENCOUNTER — Ambulatory Visit (INDEPENDENT_AMBULATORY_CARE_PROVIDER_SITE_OTHER): Payer: Self-pay | Admitting: *Deleted

## 2015-09-20 DIAGNOSIS — Z5181 Encounter for therapeutic drug level monitoring: Secondary | ICD-10-CM

## 2015-09-20 DIAGNOSIS — I2699 Other pulmonary embolism without acute cor pulmonale: Secondary | ICD-10-CM

## 2015-09-20 DIAGNOSIS — D6862 Lupus anticoagulant syndrome: Secondary | ICD-10-CM

## 2015-09-20 DIAGNOSIS — Z7901 Long term (current) use of anticoagulants: Secondary | ICD-10-CM

## 2015-09-20 LAB — POCT INR: INR: 3.2

## 2015-11-15 ENCOUNTER — Ambulatory Visit (INDEPENDENT_AMBULATORY_CARE_PROVIDER_SITE_OTHER): Payer: Self-pay | Admitting: Pharmacist

## 2015-11-15 DIAGNOSIS — I2699 Other pulmonary embolism without acute cor pulmonale: Secondary | ICD-10-CM

## 2015-11-15 DIAGNOSIS — Z5181 Encounter for therapeutic drug level monitoring: Secondary | ICD-10-CM

## 2015-11-15 DIAGNOSIS — Z7901 Long term (current) use of anticoagulants: Secondary | ICD-10-CM

## 2015-11-15 DIAGNOSIS — D6862 Lupus anticoagulant syndrome: Secondary | ICD-10-CM

## 2015-11-15 LAB — POCT INR: INR: 3

## 2016-01-03 ENCOUNTER — Ambulatory Visit (INDEPENDENT_AMBULATORY_CARE_PROVIDER_SITE_OTHER): Payer: Self-pay | Admitting: *Deleted

## 2016-01-03 DIAGNOSIS — I2699 Other pulmonary embolism without acute cor pulmonale: Secondary | ICD-10-CM

## 2016-01-03 DIAGNOSIS — Z7901 Long term (current) use of anticoagulants: Secondary | ICD-10-CM

## 2016-01-03 DIAGNOSIS — D6862 Lupus anticoagulant syndrome: Secondary | ICD-10-CM

## 2016-01-03 DIAGNOSIS — Z5181 Encounter for therapeutic drug level monitoring: Secondary | ICD-10-CM

## 2016-01-03 LAB — POCT INR: INR: 2.9

## 2016-02-14 ENCOUNTER — Ambulatory Visit (INDEPENDENT_AMBULATORY_CARE_PROVIDER_SITE_OTHER): Payer: Self-pay | Admitting: *Deleted

## 2016-02-14 DIAGNOSIS — Z5181 Encounter for therapeutic drug level monitoring: Secondary | ICD-10-CM

## 2016-02-14 DIAGNOSIS — D6862 Lupus anticoagulant syndrome: Secondary | ICD-10-CM

## 2016-02-14 DIAGNOSIS — I2699 Other pulmonary embolism without acute cor pulmonale: Secondary | ICD-10-CM

## 2016-02-14 DIAGNOSIS — Z7901 Long term (current) use of anticoagulants: Secondary | ICD-10-CM

## 2016-02-14 LAB — POCT INR: INR: 2.8

## 2016-03-01 ENCOUNTER — Other Ambulatory Visit: Payer: Self-pay | Admitting: *Deleted

## 2016-03-01 ENCOUNTER — Other Ambulatory Visit: Payer: Self-pay | Admitting: Cardiology

## 2016-03-04 ENCOUNTER — Other Ambulatory Visit: Payer: Self-pay | Admitting: *Deleted

## 2016-03-04 MED ORDER — WARFARIN SODIUM 5 MG PO TABS: ORAL_TABLET | ORAL | 2 refills | Status: DC

## 2016-04-17 ENCOUNTER — Ambulatory Visit (INDEPENDENT_AMBULATORY_CARE_PROVIDER_SITE_OTHER): Payer: Self-pay | Admitting: *Deleted

## 2016-04-17 DIAGNOSIS — I2699 Other pulmonary embolism without acute cor pulmonale: Secondary | ICD-10-CM

## 2016-04-17 DIAGNOSIS — D6862 Lupus anticoagulant syndrome: Secondary | ICD-10-CM

## 2016-04-17 DIAGNOSIS — Z5181 Encounter for therapeutic drug level monitoring: Secondary | ICD-10-CM

## 2016-04-17 DIAGNOSIS — Z7901 Long term (current) use of anticoagulants: Secondary | ICD-10-CM

## 2016-04-17 LAB — POCT INR: INR: 2.6

## 2016-05-29 ENCOUNTER — Ambulatory Visit (INDEPENDENT_AMBULATORY_CARE_PROVIDER_SITE_OTHER): Payer: Self-pay | Admitting: *Deleted

## 2016-05-29 DIAGNOSIS — I2699 Other pulmonary embolism without acute cor pulmonale: Secondary | ICD-10-CM

## 2016-05-29 DIAGNOSIS — D6862 Lupus anticoagulant syndrome: Secondary | ICD-10-CM

## 2016-05-29 DIAGNOSIS — Z5181 Encounter for therapeutic drug level monitoring: Secondary | ICD-10-CM

## 2016-05-29 DIAGNOSIS — Z7901 Long term (current) use of anticoagulants: Secondary | ICD-10-CM

## 2016-05-29 LAB — POCT INR: INR: 3

## 2016-06-18 ENCOUNTER — Other Ambulatory Visit: Payer: Self-pay | Admitting: Cardiology

## 2016-07-10 ENCOUNTER — Encounter: Payer: Self-pay | Admitting: *Deleted

## 2016-07-31 ENCOUNTER — Ambulatory Visit (INDEPENDENT_AMBULATORY_CARE_PROVIDER_SITE_OTHER): Payer: Self-pay | Admitting: *Deleted

## 2016-07-31 DIAGNOSIS — Z7901 Long term (current) use of anticoagulants: Secondary | ICD-10-CM

## 2016-07-31 DIAGNOSIS — D6862 Lupus anticoagulant syndrome: Secondary | ICD-10-CM

## 2016-07-31 DIAGNOSIS — I2699 Other pulmonary embolism without acute cor pulmonale: Secondary | ICD-10-CM

## 2016-07-31 DIAGNOSIS — Z5181 Encounter for therapeutic drug level monitoring: Secondary | ICD-10-CM

## 2016-07-31 LAB — POCT INR: INR: 3.3

## 2016-09-02 ENCOUNTER — Ambulatory Visit (INDEPENDENT_AMBULATORY_CARE_PROVIDER_SITE_OTHER): Payer: Self-pay | Admitting: *Deleted

## 2016-09-02 DIAGNOSIS — I2699 Other pulmonary embolism without acute cor pulmonale: Secondary | ICD-10-CM

## 2016-09-02 DIAGNOSIS — D6862 Lupus anticoagulant syndrome: Secondary | ICD-10-CM

## 2016-09-02 DIAGNOSIS — Z7901 Long term (current) use of anticoagulants: Secondary | ICD-10-CM

## 2016-09-02 DIAGNOSIS — Z5181 Encounter for therapeutic drug level monitoring: Secondary | ICD-10-CM

## 2016-09-02 LAB — POCT INR: INR: 2.4

## 2016-10-02 ENCOUNTER — Encounter: Payer: Self-pay | Admitting: *Deleted

## 2016-10-09 ENCOUNTER — Encounter: Payer: Self-pay | Admitting: *Deleted

## 2016-10-17 ENCOUNTER — Ambulatory Visit (INDEPENDENT_AMBULATORY_CARE_PROVIDER_SITE_OTHER): Payer: Self-pay | Admitting: *Deleted

## 2016-10-17 DIAGNOSIS — Z5181 Encounter for therapeutic drug level monitoring: Secondary | ICD-10-CM

## 2016-10-17 DIAGNOSIS — Z7901 Long term (current) use of anticoagulants: Secondary | ICD-10-CM

## 2016-10-17 DIAGNOSIS — I2699 Other pulmonary embolism without acute cor pulmonale: Secondary | ICD-10-CM

## 2016-10-17 DIAGNOSIS — D6862 Lupus anticoagulant syndrome: Secondary | ICD-10-CM

## 2016-10-17 LAB — POCT INR: INR: 3.4

## 2016-10-30 ENCOUNTER — Ambulatory Visit (INDEPENDENT_AMBULATORY_CARE_PROVIDER_SITE_OTHER): Payer: Self-pay | Admitting: *Deleted

## 2016-10-30 DIAGNOSIS — Z5181 Encounter for therapeutic drug level monitoring: Secondary | ICD-10-CM

## 2016-10-30 DIAGNOSIS — D6862 Lupus anticoagulant syndrome: Secondary | ICD-10-CM

## 2016-10-30 DIAGNOSIS — Z7901 Long term (current) use of anticoagulants: Secondary | ICD-10-CM

## 2016-10-30 DIAGNOSIS — I2699 Other pulmonary embolism without acute cor pulmonale: Secondary | ICD-10-CM

## 2016-10-30 LAB — POCT INR: INR: 2.2

## 2016-11-27 ENCOUNTER — Encounter: Payer: Self-pay | Admitting: *Deleted

## 2016-12-04 ENCOUNTER — Ambulatory Visit (INDEPENDENT_AMBULATORY_CARE_PROVIDER_SITE_OTHER): Payer: Self-pay | Admitting: *Deleted

## 2016-12-04 DIAGNOSIS — Z7901 Long term (current) use of anticoagulants: Secondary | ICD-10-CM

## 2016-12-04 DIAGNOSIS — I2699 Other pulmonary embolism without acute cor pulmonale: Secondary | ICD-10-CM

## 2016-12-04 DIAGNOSIS — D6862 Lupus anticoagulant syndrome: Secondary | ICD-10-CM

## 2016-12-04 DIAGNOSIS — Z5181 Encounter for therapeutic drug level monitoring: Secondary | ICD-10-CM

## 2016-12-04 LAB — POCT INR: INR: 3.2

## 2016-12-23 ENCOUNTER — Other Ambulatory Visit: Payer: Self-pay | Admitting: Cardiology

## 2017-01-01 ENCOUNTER — Ambulatory Visit (INDEPENDENT_AMBULATORY_CARE_PROVIDER_SITE_OTHER): Payer: Self-pay | Admitting: *Deleted

## 2017-01-01 DIAGNOSIS — Z7901 Long term (current) use of anticoagulants: Secondary | ICD-10-CM

## 2017-01-01 DIAGNOSIS — Z5181 Encounter for therapeutic drug level monitoring: Secondary | ICD-10-CM

## 2017-01-01 DIAGNOSIS — I2699 Other pulmonary embolism without acute cor pulmonale: Secondary | ICD-10-CM

## 2017-01-01 DIAGNOSIS — D6862 Lupus anticoagulant syndrome: Secondary | ICD-10-CM

## 2017-01-01 LAB — POCT INR: INR: 3

## 2017-01-29 ENCOUNTER — Ambulatory Visit (INDEPENDENT_AMBULATORY_CARE_PROVIDER_SITE_OTHER): Payer: Self-pay | Admitting: *Deleted

## 2017-01-29 DIAGNOSIS — Z7901 Long term (current) use of anticoagulants: Secondary | ICD-10-CM

## 2017-01-29 DIAGNOSIS — I2699 Other pulmonary embolism without acute cor pulmonale: Secondary | ICD-10-CM

## 2017-01-29 DIAGNOSIS — D6862 Lupus anticoagulant syndrome: Secondary | ICD-10-CM

## 2017-01-29 DIAGNOSIS — Z5181 Encounter for therapeutic drug level monitoring: Secondary | ICD-10-CM

## 2017-01-29 LAB — POCT INR: INR: 2.4

## 2017-03-12 ENCOUNTER — Ambulatory Visit (INDEPENDENT_AMBULATORY_CARE_PROVIDER_SITE_OTHER): Payer: Self-pay | Admitting: *Deleted

## 2017-03-12 DIAGNOSIS — I2699 Other pulmonary embolism without acute cor pulmonale: Secondary | ICD-10-CM

## 2017-03-12 DIAGNOSIS — Z5181 Encounter for therapeutic drug level monitoring: Secondary | ICD-10-CM

## 2017-03-12 DIAGNOSIS — Z7901 Long term (current) use of anticoagulants: Secondary | ICD-10-CM

## 2017-03-12 DIAGNOSIS — D6862 Lupus anticoagulant syndrome: Secondary | ICD-10-CM

## 2017-03-12 LAB — POCT INR: INR: 2.2

## 2017-04-23 ENCOUNTER — Ambulatory Visit (INDEPENDENT_AMBULATORY_CARE_PROVIDER_SITE_OTHER): Payer: Self-pay | Admitting: *Deleted

## 2017-04-23 DIAGNOSIS — I2699 Other pulmonary embolism without acute cor pulmonale: Secondary | ICD-10-CM

## 2017-04-23 DIAGNOSIS — D6862 Lupus anticoagulant syndrome: Secondary | ICD-10-CM

## 2017-04-23 DIAGNOSIS — Z5181 Encounter for therapeutic drug level monitoring: Secondary | ICD-10-CM

## 2017-04-23 DIAGNOSIS — Z7901 Long term (current) use of anticoagulants: Secondary | ICD-10-CM

## 2017-04-23 LAB — POCT INR: INR: 3

## 2017-06-04 ENCOUNTER — Ambulatory Visit (INDEPENDENT_AMBULATORY_CARE_PROVIDER_SITE_OTHER): Payer: Self-pay | Admitting: *Deleted

## 2017-06-04 DIAGNOSIS — Z5181 Encounter for therapeutic drug level monitoring: Secondary | ICD-10-CM

## 2017-06-04 DIAGNOSIS — D6862 Lupus anticoagulant syndrome: Secondary | ICD-10-CM

## 2017-06-04 LAB — POCT INR: INR: 2.9

## 2017-06-04 NOTE — Patient Instructions (Signed)
Continue 1 tablet daily except 1 1/2 tablets on Mondays and Thursdays Recheck in 6 weeks  

## 2017-06-22 ENCOUNTER — Other Ambulatory Visit: Payer: Self-pay | Admitting: Cardiology

## 2017-07-16 ENCOUNTER — Ambulatory Visit (INDEPENDENT_AMBULATORY_CARE_PROVIDER_SITE_OTHER): Payer: Self-pay | Admitting: *Deleted

## 2017-07-16 DIAGNOSIS — I2699 Other pulmonary embolism without acute cor pulmonale: Secondary | ICD-10-CM

## 2017-07-16 DIAGNOSIS — Z7901 Long term (current) use of anticoagulants: Secondary | ICD-10-CM

## 2017-07-16 DIAGNOSIS — D6862 Lupus anticoagulant syndrome: Secondary | ICD-10-CM

## 2017-07-16 DIAGNOSIS — Z5181 Encounter for therapeutic drug level monitoring: Secondary | ICD-10-CM

## 2017-07-16 LAB — POCT INR: INR: 2.7

## 2017-07-16 NOTE — Patient Instructions (Signed)
Continue 1 tablet daily except 1 1/2 tablets on Mondays and Thursdays Recheck in 6 weeks  

## 2017-09-08 ENCOUNTER — Ambulatory Visit (INDEPENDENT_AMBULATORY_CARE_PROVIDER_SITE_OTHER): Payer: Self-pay | Admitting: *Deleted

## 2017-09-08 DIAGNOSIS — Z5181 Encounter for therapeutic drug level monitoring: Secondary | ICD-10-CM

## 2017-09-08 DIAGNOSIS — Z7901 Long term (current) use of anticoagulants: Secondary | ICD-10-CM

## 2017-09-08 DIAGNOSIS — I2699 Other pulmonary embolism without acute cor pulmonale: Secondary | ICD-10-CM

## 2017-09-08 DIAGNOSIS — D6862 Lupus anticoagulant syndrome: Secondary | ICD-10-CM

## 2017-09-08 LAB — POCT INR: INR: 3.4

## 2017-09-08 NOTE — Patient Instructions (Signed)
Hold coumadin tonight then resume 1 tablet daily except 1 1/2 tablets on Mondays and Thursdays Recheck in 3 weeks 

## 2017-10-03 ENCOUNTER — Ambulatory Visit (INDEPENDENT_AMBULATORY_CARE_PROVIDER_SITE_OTHER): Payer: Self-pay | Admitting: *Deleted

## 2017-10-03 DIAGNOSIS — Z7901 Long term (current) use of anticoagulants: Secondary | ICD-10-CM

## 2017-10-03 DIAGNOSIS — Z5181 Encounter for therapeutic drug level monitoring: Secondary | ICD-10-CM

## 2017-10-03 DIAGNOSIS — D6862 Lupus anticoagulant syndrome: Secondary | ICD-10-CM

## 2017-10-03 DIAGNOSIS — I2699 Other pulmonary embolism without acute cor pulmonale: Secondary | ICD-10-CM

## 2017-10-03 LAB — POCT INR: INR: 2.4 (ref 2.0–3.0)

## 2017-10-03 NOTE — Patient Instructions (Signed)
Continue coumadin 1 tablet daily except 1 1/2 tablets on Mondays and Thursdays. Recheck in 4 weeks 

## 2017-11-03 ENCOUNTER — Ambulatory Visit (INDEPENDENT_AMBULATORY_CARE_PROVIDER_SITE_OTHER): Payer: Self-pay | Admitting: *Deleted

## 2017-11-03 DIAGNOSIS — Z5181 Encounter for therapeutic drug level monitoring: Secondary | ICD-10-CM

## 2017-11-03 DIAGNOSIS — D6862 Lupus anticoagulant syndrome: Secondary | ICD-10-CM

## 2017-11-03 DIAGNOSIS — I2699 Other pulmonary embolism without acute cor pulmonale: Secondary | ICD-10-CM

## 2017-11-03 DIAGNOSIS — Z7901 Long term (current) use of anticoagulants: Secondary | ICD-10-CM

## 2017-11-03 LAB — POCT INR: INR: 2.9 (ref 2.0–3.0)

## 2017-11-03 NOTE — Patient Instructions (Signed)
Continue coumadin 1 tablet daily except 1 1/2 tablets on Mondays and Thursdays. Recheck in 4 weeks 

## 2017-12-08 ENCOUNTER — Ambulatory Visit (INDEPENDENT_AMBULATORY_CARE_PROVIDER_SITE_OTHER): Payer: Self-pay | Admitting: Pharmacist

## 2017-12-08 DIAGNOSIS — Z5181 Encounter for therapeutic drug level monitoring: Secondary | ICD-10-CM

## 2017-12-08 DIAGNOSIS — Z7901 Long term (current) use of anticoagulants: Secondary | ICD-10-CM

## 2017-12-08 DIAGNOSIS — I2699 Other pulmonary embolism without acute cor pulmonale: Secondary | ICD-10-CM

## 2017-12-08 DIAGNOSIS — D6862 Lupus anticoagulant syndrome: Secondary | ICD-10-CM

## 2017-12-08 LAB — POCT INR: INR: 3 (ref 2.0–3.0)

## 2017-12-08 NOTE — Patient Instructions (Signed)
Description   Continue coumadin 1 tablet daily except 1 1/2 tablets on Mondays and Thursdays.  Recheck in 5 weeks.

## 2017-12-20 ENCOUNTER — Other Ambulatory Visit: Payer: Self-pay | Admitting: Cardiology

## 2018-01-12 ENCOUNTER — Ambulatory Visit (INDEPENDENT_AMBULATORY_CARE_PROVIDER_SITE_OTHER): Payer: Self-pay | Admitting: *Deleted

## 2018-01-12 DIAGNOSIS — Z5181 Encounter for therapeutic drug level monitoring: Secondary | ICD-10-CM

## 2018-01-12 DIAGNOSIS — I2699 Other pulmonary embolism without acute cor pulmonale: Secondary | ICD-10-CM

## 2018-01-12 DIAGNOSIS — D6862 Lupus anticoagulant syndrome: Secondary | ICD-10-CM

## 2018-01-12 DIAGNOSIS — Z7901 Long term (current) use of anticoagulants: Secondary | ICD-10-CM

## 2018-01-12 LAB — POCT INR: INR: 2.6 (ref 2.0–3.0)

## 2018-01-12 NOTE — Patient Instructions (Signed)
Continue coumadin 1 tablet daily except 1 1/2 tablets on Mondays and Thursdays. Recheck in 6 weeks 

## 2018-03-02 ENCOUNTER — Ambulatory Visit (INDEPENDENT_AMBULATORY_CARE_PROVIDER_SITE_OTHER): Payer: Self-pay | Admitting: *Deleted

## 2018-03-02 DIAGNOSIS — Z5181 Encounter for therapeutic drug level monitoring: Secondary | ICD-10-CM

## 2018-03-02 DIAGNOSIS — Z7901 Long term (current) use of anticoagulants: Secondary | ICD-10-CM

## 2018-03-02 DIAGNOSIS — D6862 Lupus anticoagulant syndrome: Secondary | ICD-10-CM

## 2018-03-02 DIAGNOSIS — I2699 Other pulmonary embolism without acute cor pulmonale: Secondary | ICD-10-CM

## 2018-03-02 LAB — POCT INR: INR: 2.5 (ref 2.0–3.0)

## 2018-03-02 NOTE — Patient Instructions (Signed)
Continue coumadin 1 tablet daily except 1 1/2 tablets on Mondays and Thursdays. Recheck in 6 weeks 

## 2018-04-13 ENCOUNTER — Ambulatory Visit (INDEPENDENT_AMBULATORY_CARE_PROVIDER_SITE_OTHER): Payer: Self-pay | Admitting: *Deleted

## 2018-04-13 DIAGNOSIS — Z7901 Long term (current) use of anticoagulants: Secondary | ICD-10-CM

## 2018-04-13 DIAGNOSIS — Z5181 Encounter for therapeutic drug level monitoring: Secondary | ICD-10-CM

## 2018-04-13 DIAGNOSIS — I2699 Other pulmonary embolism without acute cor pulmonale: Secondary | ICD-10-CM

## 2018-04-13 DIAGNOSIS — D6862 Lupus anticoagulant syndrome: Secondary | ICD-10-CM

## 2018-04-13 LAB — POCT INR: INR: 2.8 (ref 2.0–3.0)

## 2018-04-13 NOTE — Patient Instructions (Signed)
Continue coumadin 1 tablet daily except 1 1/2 tablets on Mondays and Thursdays. Recheck in 6 weeks 

## 2018-05-12 ENCOUNTER — Other Ambulatory Visit: Payer: Self-pay | Admitting: Cardiology

## 2018-05-25 ENCOUNTER — Ambulatory Visit (INDEPENDENT_AMBULATORY_CARE_PROVIDER_SITE_OTHER): Payer: Self-pay | Admitting: Pharmacist

## 2018-05-25 DIAGNOSIS — Z7901 Long term (current) use of anticoagulants: Secondary | ICD-10-CM

## 2018-05-25 DIAGNOSIS — D6862 Lupus anticoagulant syndrome: Secondary | ICD-10-CM

## 2018-05-25 DIAGNOSIS — Z5181 Encounter for therapeutic drug level monitoring: Secondary | ICD-10-CM

## 2018-05-25 DIAGNOSIS — I2699 Other pulmonary embolism without acute cor pulmonale: Secondary | ICD-10-CM

## 2018-05-25 LAB — POCT INR: INR: 3.7 — AB (ref 2.0–3.0)

## 2018-05-25 NOTE — Patient Instructions (Signed)
Description   No Coumadin today then continue coumadin 1 tablet daily except 1 1/2 tablets on Mondays and Thursdays.  Recheck in 4 weeks.

## 2018-06-24 ENCOUNTER — Ambulatory Visit (INDEPENDENT_AMBULATORY_CARE_PROVIDER_SITE_OTHER): Payer: Self-pay | Admitting: *Deleted

## 2018-06-24 DIAGNOSIS — Z7901 Long term (current) use of anticoagulants: Secondary | ICD-10-CM

## 2018-06-24 DIAGNOSIS — I2699 Other pulmonary embolism without acute cor pulmonale: Secondary | ICD-10-CM

## 2018-06-24 DIAGNOSIS — Z5181 Encounter for therapeutic drug level monitoring: Secondary | ICD-10-CM

## 2018-06-24 DIAGNOSIS — D6862 Lupus anticoagulant syndrome: Secondary | ICD-10-CM

## 2018-06-24 LAB — POCT INR: INR: 2.5 (ref 2.0–3.0)

## 2018-06-24 NOTE — Patient Instructions (Signed)
Continue coumadin 1 tablet daily except 1 1/2 tablets on Mondays and Thursdays. Recheck in 4 weeks 

## 2018-07-28 ENCOUNTER — Telehealth: Payer: Self-pay | Admitting: Pharmacist

## 2018-07-28 NOTE — Telephone Encounter (Signed)

## 2018-07-30 ENCOUNTER — Ambulatory Visit (INDEPENDENT_AMBULATORY_CARE_PROVIDER_SITE_OTHER): Payer: Self-pay | Admitting: *Deleted

## 2018-07-30 DIAGNOSIS — D6862 Lupus anticoagulant syndrome: Secondary | ICD-10-CM

## 2018-07-30 DIAGNOSIS — Z5181 Encounter for therapeutic drug level monitoring: Secondary | ICD-10-CM

## 2018-07-30 DIAGNOSIS — I2699 Other pulmonary embolism without acute cor pulmonale: Secondary | ICD-10-CM

## 2018-07-30 DIAGNOSIS — Z7901 Long term (current) use of anticoagulants: Secondary | ICD-10-CM

## 2018-07-30 LAB — POCT INR: INR: 2.4 (ref 2.0–3.0)

## 2018-07-30 NOTE — Patient Instructions (Signed)
Continue coumadin 1 tablet daily except 1 1/2 tablets on Mondays and Thursdays.  Recheck in 5 weeks.

## 2018-09-01 ENCOUNTER — Telehealth: Payer: Self-pay | Admitting: *Deleted

## 2018-09-01 NOTE — Telephone Encounter (Signed)
° ° ° °  COVID-19 Pre-Screening Questions: ° °• Do you currently have a fever? No °•  °• Have you recently travelled on a cruise, internationally, or to [place], [place], [place], [place], [place], or [place], [place] ([employer]) ? No °•  °• Have you been in contact with someone that is currently pending confirmation of Covid19 testing or has been confirmed to have the Covid19 virus? No °•  °• Are you currently experiencing fatigue or cough? No  ° ° °   ° ° ° ° ° °

## 2018-09-10 ENCOUNTER — Other Ambulatory Visit: Payer: Self-pay

## 2018-09-10 ENCOUNTER — Ambulatory Visit (INDEPENDENT_AMBULATORY_CARE_PROVIDER_SITE_OTHER): Payer: Self-pay | Admitting: *Deleted

## 2018-09-10 DIAGNOSIS — D6862 Lupus anticoagulant syndrome: Secondary | ICD-10-CM

## 2018-09-10 DIAGNOSIS — Z5181 Encounter for therapeutic drug level monitoring: Secondary | ICD-10-CM

## 2018-09-10 DIAGNOSIS — I2699 Other pulmonary embolism without acute cor pulmonale: Secondary | ICD-10-CM

## 2018-09-10 DIAGNOSIS — Z7901 Long term (current) use of anticoagulants: Secondary | ICD-10-CM

## 2018-09-10 LAB — POCT INR: INR: 2.8 (ref 2.0–3.0)

## 2018-09-10 NOTE — Patient Instructions (Signed)
Continue coumadin 1 tablet daily except 1 1/2 tablets on Mondays and Thursdays. Recheck in 6 weeks 

## 2018-10-09 ENCOUNTER — Other Ambulatory Visit: Payer: Self-pay | Admitting: Cardiology

## 2018-10-12 ENCOUNTER — Other Ambulatory Visit: Payer: Self-pay | Admitting: *Deleted

## 2018-10-12 MED ORDER — WARFARIN SODIUM 5 MG PO TABS: ORAL_TABLET | ORAL | 1 refills | Status: DC

## 2018-10-21 ENCOUNTER — Ambulatory Visit (INDEPENDENT_AMBULATORY_CARE_PROVIDER_SITE_OTHER): Payer: Self-pay | Admitting: *Deleted

## 2018-10-21 DIAGNOSIS — Z7901 Long term (current) use of anticoagulants: Secondary | ICD-10-CM

## 2018-10-21 DIAGNOSIS — D6862 Lupus anticoagulant syndrome: Secondary | ICD-10-CM

## 2018-10-21 DIAGNOSIS — Z5181 Encounter for therapeutic drug level monitoring: Secondary | ICD-10-CM

## 2018-10-21 DIAGNOSIS — I2699 Other pulmonary embolism without acute cor pulmonale: Secondary | ICD-10-CM

## 2018-10-21 LAB — POCT INR: INR: 3.3 — AB (ref 2.0–3.0)

## 2018-10-21 NOTE — Patient Instructions (Signed)
Hold coumadin tonight then resume 1 tablet daily except 1 1/2 tablets on Mondays and Thursdays.  Recheck in 6 weeks

## 2018-11-17 ENCOUNTER — Other Ambulatory Visit: Payer: Self-pay | Admitting: Cardiology

## 2018-12-14 ENCOUNTER — Ambulatory Visit: Payer: Self-pay | Admitting: Cardiology

## 2018-12-21 ENCOUNTER — Telehealth: Payer: Self-pay | Admitting: *Deleted

## 2018-12-21 ENCOUNTER — Other Ambulatory Visit: Payer: Self-pay

## 2018-12-21 ENCOUNTER — Ambulatory Visit (INDEPENDENT_AMBULATORY_CARE_PROVIDER_SITE_OTHER): Payer: Self-pay | Admitting: Pharmacist Clinician (PhC)/ Clinical Pharmacy Specialist

## 2018-12-21 DIAGNOSIS — D6862 Lupus anticoagulant syndrome: Secondary | ICD-10-CM

## 2018-12-21 DIAGNOSIS — Z5181 Encounter for therapeutic drug level monitoring: Secondary | ICD-10-CM

## 2018-12-21 DIAGNOSIS — Z7901 Long term (current) use of anticoagulants: Secondary | ICD-10-CM

## 2018-12-21 DIAGNOSIS — I2699 Other pulmonary embolism without acute cor pulmonale: Secondary | ICD-10-CM

## 2018-12-21 LAB — POCT INR: INR: 2 (ref 2.0–3.0)

## 2018-12-21 NOTE — Telephone Encounter (Signed)

## 2018-12-28 ENCOUNTER — Other Ambulatory Visit: Payer: Self-pay

## 2018-12-28 ENCOUNTER — Emergency Department (HOSPITAL_COMMUNITY)
Admission: EM | Admit: 2018-12-28 | Discharge: 2018-12-28 | Disposition: A | Discharge status: Home / Self Care | Payer: Self-pay | Attending: Emergency Medicine | Admitting: Emergency Medicine

## 2018-12-28 ENCOUNTER — Encounter (HOSPITAL_COMMUNITY): Payer: Self-pay | Admitting: Emergency Medicine

## 2018-12-28 DIAGNOSIS — Y929 Unspecified place or not applicable: Secondary | ICD-10-CM | POA: Insufficient documentation

## 2018-12-28 DIAGNOSIS — F17210 Nicotine dependence, cigarettes, uncomplicated: Secondary | ICD-10-CM | POA: Insufficient documentation

## 2018-12-28 DIAGNOSIS — Z7901 Long term (current) use of anticoagulants: Secondary | ICD-10-CM | POA: Insufficient documentation

## 2018-12-28 DIAGNOSIS — W268XXA Contact with other sharp object(s), not elsewhere classified, initial encounter: Secondary | ICD-10-CM | POA: Insufficient documentation

## 2018-12-28 DIAGNOSIS — S91311A Laceration without foreign body, right foot, initial encounter: Secondary | ICD-10-CM | POA: Insufficient documentation

## 2018-12-28 DIAGNOSIS — Y999 Unspecified external cause status: Secondary | ICD-10-CM | POA: Insufficient documentation

## 2018-12-28 DIAGNOSIS — Y939 Activity, unspecified: Secondary | ICD-10-CM | POA: Insufficient documentation

## 2018-12-28 MED ORDER — BACITRACIN ZINC 500 UNIT/GM EX OINT
TOPICAL_OINTMENT | Freq: Once | CUTANEOUS | Status: AC
  Administered 2018-12-28: 1 via TOPICAL
  Filled 2018-12-28: qty 0.9

## 2018-12-28 MED ORDER — LIDOCAINE HCL (PF) 1 % IJ SOLN
5.0000 mL | Freq: Once | INTRAMUSCULAR | Status: AC
  Administered 2018-12-28: 5 mL
  Filled 2018-12-28: qty 6

## 2018-12-28 MED ORDER — CEPHALEXIN 500 MG PO CAPS: 500.0000 mg | ORAL_CAPSULE | Freq: Four times a day (QID) | ORAL | 0 refills | Status: DC

## 2018-12-28 NOTE — ED Triage Notes (Signed)
Pt here for laceration to right foot from wall scraper. Bleeding controlled. Pt does take Coumadin.

## 2018-12-28 NOTE — ED Provider Notes (Signed)
[facility] Provider Note   CSN: [id number] Arrival date & time: 12/28/18  1406     History   Chief Complaint Chief Complaint  Patient presents with  . Laceration    HPI Andrew [name] is a 48 y.o. 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  Patient reports he was working in flip-flops when he cut his toe on a wall scraper.  He reports it just glanced the sharp edge.  It was not a significant trauma.  Bleeding controlled on arrival.  Patient has some tingling at the wound only.  He denies any other injuries.  His tetanus is up-to-date.  He denies any other injuries.     HPI  Past Medical History:  Diagnosis Date  . Chronic anticoagulation   . Hepatic steatosis 04/04/2011  . Pulmonary embolism [facility](HCC)    2006    Patient Active Problem List   Diagnosis Date Noted  . Encounter for therapeutic drug monitoring 07/08/2013  . Tobacco abuse 04/04/2011  . Hepatic steatosis 04/04/2011  . Lupus anticoagulant disorder (HCC) 07/27/2010  . Chronic anticoagulation 07/27/2010  . PULMONARY EMBOLISM 09/13/2009    Past Surgical History:  Procedure Laterality Date  . CHEST TUBE INSERTION     for pulmonary embolism  . COLONOSCOPY  none   Not yet performed or indicated as of 2012        Home Medications    Prior to Admission medications   Medication Sig Start Date End Date Taking? Authorizing Provider  cephALEXin (KEFLEX) 500 MG capsule Take 1 capsule (500 mg total) by mouth 4 (four) times daily. 12/28/18   Emi HolesLaw, Kenlynn Houde M, PA-C  warfarin (COUMADIN) 5 MG tablet Take 1 tablet daily except 1 1/2 tablets on Mondays and Thursdays 11/17/18   Antoine PocheBranch, Jonathan F, MD    Family History History reviewed. No pertinent family history.  Social History Social History   Tobacco Use  . Smoking status: Current Every Day Smoker    Packs/day: 1.00    Types: Cigarettes    Start date: 05/06/1985  Substance Use  Topics  . Alcohol use: No    Alcohol/week: 0.0 standard drinks  . Drug use: No     Allergies   Patient has no known allergies.   Review of Systems Review of Systems  Constitutional: Negative for chills and fever.  HENT: Negative for facial swelling and sore throat.   Respiratory: Negative for shortness of breath.   Cardiovascular: Negative for chest pain.  Gastrointestinal: Negative for abdominal pain, nausea and vomiting.  Genitourinary: Negative for dysuria.  Musculoskeletal: Negative for back pain.  Skin: Positive for wound. Negative for rash.  Neurological: Negative for headaches.  Psychiatric/Behavioral: The patient is not nervous/anxious.      Physical Exam Updated Vital Signs BP (!) 127/95 (BP Location: Right Arm)   Pulse 86   Temp 98.4 F (36.9 C) (Oral)   Resp 18   Ht 6' (1.829 m)   Wt 89.4 kg   SpO2 96%   BMI 26.72 kg/m   Physical Exam Vitals signs and nursing note reviewed.  Constitutional:      General: He is not in acute distress.    Appearance: He is well-developed. He is not diaphoretic.  HENT:     Head: Normocephalic and atraumatic.     Mouth/Throat:     Pharynx: No oropharyngeal exudate.  Eyes:     General: No scleral icterus.  Right eye: No discharge.        Left eye: No discharge.     Conjunctiva/sclera: Conjunctivae normal.     Pupils: Pupils are equal, round, and reactive to light.  Neck:     Musculoskeletal: Normal range of motion and neck supple.     Thyroid: No thyromegaly.  Cardiovascular:     Rate and Rhythm: Normal rate and regular rhythm.     Heart sounds: Normal heart sounds. No murmur. No friction rub. No gallop.   Pulmonary:     Effort: Pulmonary effort is normal. No respiratory distress.     Breath sounds: Normal breath sounds. No stridor. No wheezing or rales.  Abdominal:     General: Bowel sounds are normal. There is no distension.     Palpations: Abdomen is soft.     Tenderness: There is no abdominal tenderness.  There is no guarding or rebound.  Musculoskeletal:       Feet:  Lymphadenopathy:     Cervical: No cervical adenopathy.  Skin:    General: Skin is warm and dry.     Coloration: Skin is not pale.     Findings: No rash.  Neurological:     Mental Status: He is alert.     Coordination: Coordination normal.      ED Treatments / Results  Labs (all labs ordered are listed, but only abnormal results are displayed) Labs Reviewed - No data to display  EKG None  Radiology No results found.  Procedures .Marland KitchenLaceration Repair  Date/Time: 12/28/2018 4:59 PM Performed by: Frederica Kuster, PA-C Authorized by: Frederica Kuster, PA-C   Consent:    Consent obtained:  Verbal   Consent given by:  Patient   Risks discussed:  Infection and pain   Alternatives discussed:  No treatment Anesthesia (see MAR for exact dosages):    Anesthesia method:  Local infiltration   Local anesthetic:  Lidocaine 1% w/o epi Laceration details:    Location:  Foot   Length (cm):  2 Repair type:    Repair type:  Simple Pre-procedure details:    Preparation:  Patient was prepped and draped in usual sterile fashion Exploration:    Hemostasis achieved with:  Direct pressure   Wound exploration: wound explored through full range of motion and entire depth of wound probed and visualized     Wound extent: no foreign bodies/material noted, no muscle damage noted and no tendon damage noted     Contaminated: yes (foot dirty)   Treatment:    Area cleansed with:  Shur-Clens and saline   Amount of cleaning:  Extensive   Irrigation volume:  148mL   Irrigation method:  Pressure wash   Visualized foreign bodies/material removed: no   Skin repair:    Repair method:  Sutures   Suture size:  4-0   Wound skin closure material used: Ethilon.   Suture technique:  Simple interrupted   Number of sutures:  4 Approximation:    Approximation:  Close Post-procedure details:    Dressing:  Antibiotic ointment and  non-adherent dressing   Patient tolerance of procedure:  Tolerated well, no immediate complications   (including critical care time)  Medications Ordered in ED Medications  bacitracin ointment (has no administration in time range)  lidocaine (PF) (XYLOCAINE) 1 % injection 5 mL (5 mLs Infiltration Given by Other 12/28/18 1612)     Initial Impression / Assessment and Plan / ED Course  I have reviewed the triage vital signs and the  nursing notes.  Pertinent labs & imaging results that were available during my care of the patient were reviewed by me and considered in my medical decision making (see chart for details).        Patient with small right foot laceration.  No indication for imaging at this time.  Tetanus UTD. Laceration occurred < 12 hours prior to repair.  However, wound is next to a callus and foot is visibly dirty, as patient has been wearing flip-flops.  Will cover with Keflex for prophylaxis.  Discussed laceration care with pt and answered questions. Pt to f-u for suture removal in 10-14 days and wound check sooner should there be signs of dehiscence or infection. Pt is hemodynamically stable with no complaints prior to dc.     Final Clinical Impressions(s) / ED Diagnoses   Final diagnoses:  Laceration of right foot, initial encounter    ED Discharge Orders         Ordered    cephALEXin (KEFLEX) 500 MG capsule  4 times daily     12/28/18 [address]1622           Dimitrious Micciche M, Cordelia Poche-C 12/28/18 1702    Donnetta Hutchingook, Brian, MD 12/29/18 2052

## 2018-12-28 NOTE — Discharge Instructions (Signed)
Treatment: Take Keflex as prescribed until completed to help prevent infection.  Keep your wound dry and dressing applied until this time tomorrow. After 24 hours, you may wash with warm soapy water. Dry and apply antibiotic ointment and clean dressing. Do this daily until your sutures are removed. ° °Follow-up: Please follow-up with your primary care provider or return to emergency department in 10-14 days for suture removal. Be aware of signs of infection: fever, increasing pain, redness, swelling, drainage from the area. Please call your primary care provider or return to emergency department if you develop any of these symptoms or if any of the sutures come out prior to removal. Please return to the emergency department if you develop any other new or worsening symptoms. ° °

## 2019-02-02 ENCOUNTER — Ambulatory Visit (INDEPENDENT_AMBULATORY_CARE_PROVIDER_SITE_OTHER): Payer: Self-pay | Admitting: Pharmacist

## 2019-02-02 ENCOUNTER — Other Ambulatory Visit: Payer: Self-pay

## 2019-02-02 DIAGNOSIS — D6862 Lupus anticoagulant syndrome: Secondary | ICD-10-CM

## 2019-02-02 DIAGNOSIS — I2699 Other pulmonary embolism without acute cor pulmonale: Secondary | ICD-10-CM

## 2019-02-02 DIAGNOSIS — Z7901 Long term (current) use of anticoagulants: Secondary | ICD-10-CM

## 2019-02-02 DIAGNOSIS — Z5181 Encounter for therapeutic drug level monitoring: Secondary | ICD-10-CM

## 2019-02-02 LAB — POCT INR: INR: 2.5 (ref 2.0–3.0)

## 2019-02-02 NOTE — Patient Instructions (Signed)
  Description   Continue with 1 tablet daily except 1 and 1/2 tablets on Mondays and Thursdays.  Recheck in 6 weeks.

## 2019-02-19 ENCOUNTER — Encounter: Payer: Self-pay | Admitting: Cardiology

## 2019-02-19 ENCOUNTER — Telehealth (INDEPENDENT_AMBULATORY_CARE_PROVIDER_SITE_OTHER): Payer: Self-pay | Admitting: Cardiology

## 2019-02-19 VITALS — Ht 72.0 in | Wt 195.0 lb

## 2019-02-19 DIAGNOSIS — D6862 Lupus anticoagulant syndrome: Secondary | ICD-10-CM

## 2019-02-19 DIAGNOSIS — I2699 Other pulmonary embolism without acute cor pulmonale: Secondary | ICD-10-CM

## 2019-02-19 NOTE — Patient Instructions (Signed)
Your physician wants you to follow-up in: [place] will receive a reminder letter in the mail two months in advance. If you don't receive a letter, please call our office to schedule the follow-up appointment.  Your physician recommends that you continue on your current medications as directed. Please refer to the Current Medication list given to you today.  WE HAVE ATTACHED A LIST OF PRIMARY CARE PROVIDERS  Your physician recommends that you return for lab work - PLEASE FAST 6-8 HOURS PRIOR TO LAB WORK - [place] AT [facility]   Thank you for choosing [facility]!!

## 2019-02-19 NOTE — Progress Notes (Signed)
Virtual Visit via Telephone Note   This visit type was conducted due to national recommendations for restrictions regarding the COVID-19 Pandemic (e.g. social distancing) in an effort to limit this patient's exposure and mitigate transmission in our community.  Due to his co-morbid illnesses, this patient is at least at moderate risk for complications without adequate follow up.  This format is felt to be most appropriate for this patient at this time.  The patient did not have access to video technology/had technical difficulties with video requiring transitioning to audio format only (telephone).  All issues noted in this document were discussed and addressed.  No physical exam could be performed with this format.  Please refer to the patient's chart for his  consent to telehealth for [facility].   Date:  02/19/2019   ID:  Andrew [name], DOB [birth year], MRN [id number]  Patient Location: Home Provider Location: Office  PCP:  Patient, No Pcp Per  Cardiologist:  Carlyle Dolly, MD  Electrophysiologist:  None   Evaluation Performed:  Follow-Up Visit  Chief Complaint:  Follow up visit  History of Present Illness:    LYDON Andrew [name] is a 48 y.o. male seen today for follow up of the following medical problems.   1. Pulmonary embolism - history of prior pulmonary embolism - history of lupus anticoagulant, on long term anticoagulation.  -  Has not been interested in NOACs due to cost   - denies any bleeding issues on coumadin - no recent chest pain or SOB   SH: has no pcp   The patient does not have symptoms concerning for COVID-19 infection (fever, chills, cough, or new shortness of breath).    Past Medical History:  Diagnosis Date  . Chronic anticoagulation   . Hepatic steatosis 04/04/2011  . Pulmonary embolism ([place])    2006   Past Surgical History:  Procedure Laterality Date  . CHEST TUBE INSERTION     for pulmonary embolism  . COLONOSCOPY  none   Not yet  performed or indicated as of 2012     Current Meds  Medication Sig  . warfarin (COUMADIN) 5 MG tablet Take 1 tablet daily except 1 1/2 tablets on Mondays and Thursdays     Allergies:   Patient has no known allergies.   Social History   Tobacco Use  . Smoking status: Current Every Day Smoker    Packs/day: 1.00    Types: Cigarettes    Start date: 05/06/1985  . Smokeless tobacco: Never Used  Substance Use Topics  . Alcohol use: No    Alcohol/week: 0.0 standard drinks  . Drug use: No     Family Hx: The patient's family history is not on file.  ROS:   Please see the history of present illness.    All other systems reviewed and are negative.   Prior CV studies:   The following studies were reviewed today:    Labs/Other Tests and Data Reviewed:    EKG:  n/a  Recent Labs: No results found for requested labs within last 8760 hours.   Recent Lipid Panel No results found for: CHOL, TRIG, HDL, CHOLHDL, LDLCALC, LDLDIRECT  Wt Readings from Last 3 Encounters:  02/19/19 195 lb (88.5 kg)  12/28/18 197 lb (89.4 kg)  08/03/14 174 lb 3.2 oz (79 kg)     Objective:    Vital Signs:  Ht 6' (1.829 m)   Wt 195 lb (88.5 kg)   BMI 26.45 kg/m    Today's Vitals  02/19/19 1330  Weight: 195 lb (88.5 kg)  Height: 6' (1.829 m)   Body mass index is 26.45 kg/m.    ASSESSMENT & PLAN:    1. History of PE/Lupus anticoagulant --continue anticoagulation with coumadin - no recent symptoms   Does not have a pcp, we will order his annual labs. Strongly encouraged to obtain a pcp, we will send him a list of local doctors.   COVID-19 Education: The signs and symptoms of COVID-19 were discussed with the patient and how to seek care for testing (follow up with PCP or arrange E-visit).  The importance of social distancing was discussed today.  Time:   Today, I have spent 10 minutes with the patient with telehealth technology discussing the above problems.     Medication  Adjustments/Labs and Tests Ordered: Current medicines are reviewed at length with the patient today.  Concerns regarding medicines are outlined above.   Tests Ordered: No orders of the defined types were placed in this encounter.   Medication Changes: No orders of the defined types were placed in this encounter.   Follow Up:  In Person in 1 year(s)  Signed, Dina Rich, MD  02/19/2019 2:02 PM       Antoine Poche, M.D

## 2019-02-19 NOTE — Addendum Note (Signed)
Addended by: Julian Hy T on: 02/19/2019 02:42 PM   Modules accepted: Orders

## 2019-03-23 ENCOUNTER — Other Ambulatory Visit: Payer: Self-pay

## 2019-03-23 ENCOUNTER — Ambulatory Visit (INDEPENDENT_AMBULATORY_CARE_PROVIDER_SITE_OTHER): Payer: Self-pay | Admitting: *Deleted

## 2019-03-23 DIAGNOSIS — Z5181 Encounter for therapeutic drug level monitoring: Secondary | ICD-10-CM

## 2019-03-23 DIAGNOSIS — I2699 Other pulmonary embolism without acute cor pulmonale: Secondary | ICD-10-CM

## 2019-03-23 DIAGNOSIS — Z7901 Long term (current) use of anticoagulants: Secondary | ICD-10-CM

## 2019-03-23 DIAGNOSIS — D6862 Lupus anticoagulant syndrome: Secondary | ICD-10-CM

## 2019-03-23 LAB — POCT INR: INR: 2.3 (ref 2.0–3.0)

## 2019-03-23 NOTE — Patient Instructions (Signed)
Continue with 1 tablet daily except 1 and 1/2 tablets on Mondays and Thursdays.  Recheck in 6 weeks.

## 2019-05-10 ENCOUNTER — Other Ambulatory Visit: Payer: Self-pay

## 2019-05-10 ENCOUNTER — Ambulatory Visit (INDEPENDENT_AMBULATORY_CARE_PROVIDER_SITE_OTHER): Payer: Self-pay | Admitting: *Deleted

## 2019-05-10 DIAGNOSIS — I2699 Other pulmonary embolism without acute cor pulmonale: Secondary | ICD-10-CM

## 2019-05-10 DIAGNOSIS — D6862 Lupus anticoagulant syndrome: Secondary | ICD-10-CM

## 2019-05-10 DIAGNOSIS — Z5181 Encounter for therapeutic drug level monitoring: Secondary | ICD-10-CM

## 2019-05-10 LAB — POCT INR: INR: 2.7 (ref 2.0–3.0)

## 2019-05-10 NOTE — Patient Instructions (Signed)
Continue with 1 tablet daily except 1 1/2 tablets on Mondays and Thursdays.  Recheck in 6 weeks.  

## 2019-05-20 ENCOUNTER — Other Ambulatory Visit: Payer: Self-pay | Admitting: Cardiology

## 2019-07-26 ENCOUNTER — Ambulatory Visit (INDEPENDENT_AMBULATORY_CARE_PROVIDER_SITE_OTHER): Payer: Self-pay | Admitting: *Deleted

## 2019-07-26 ENCOUNTER — Other Ambulatory Visit: Payer: Self-pay

## 2019-07-26 DIAGNOSIS — Z5181 Encounter for therapeutic drug level monitoring: Secondary | ICD-10-CM

## 2019-07-26 DIAGNOSIS — D6862 Lupus anticoagulant syndrome: Secondary | ICD-10-CM

## 2019-07-26 DIAGNOSIS — I2699 Other pulmonary embolism without acute cor pulmonale: Secondary | ICD-10-CM

## 2019-07-26 LAB — POCT INR: INR: 2.5 (ref 2.0–3.0)

## 2019-07-26 NOTE — Patient Instructions (Signed)
Continue with 1 tablet daily except 1 1/2 tablets on Mondays and Thursdays.  Recheck in 6 weeks.

## 2019-10-07 ENCOUNTER — Other Ambulatory Visit: Payer: Self-pay

## 2019-10-07 ENCOUNTER — Ambulatory Visit (INDEPENDENT_AMBULATORY_CARE_PROVIDER_SITE_OTHER): Payer: Self-pay | Admitting: *Deleted

## 2019-10-07 DIAGNOSIS — Z5181 Encounter for therapeutic drug level monitoring: Secondary | ICD-10-CM

## 2019-10-07 DIAGNOSIS — I2699 Other pulmonary embolism without acute cor pulmonale: Secondary | ICD-10-CM

## 2019-10-07 DIAGNOSIS — D6862 Lupus anticoagulant syndrome: Secondary | ICD-10-CM

## 2019-10-07 LAB — POCT INR: INR: 2.9 (ref 2.0–3.0)

## 2019-10-07 NOTE — Patient Instructions (Signed)
Continue with 1 tablet daily except 1 1/2 tablets on Mondays and Thursdays.  Recheck in 6 weeks.

## 2019-12-07 ENCOUNTER — Ambulatory Visit (INDEPENDENT_AMBULATORY_CARE_PROVIDER_SITE_OTHER): Payer: Self-pay | Admitting: *Deleted

## 2019-12-07 DIAGNOSIS — Z5181 Encounter for therapeutic drug level monitoring: Secondary | ICD-10-CM

## 2019-12-07 DIAGNOSIS — I2699 Other pulmonary embolism without acute cor pulmonale: Secondary | ICD-10-CM

## 2019-12-07 DIAGNOSIS — D6862 Lupus anticoagulant syndrome: Secondary | ICD-10-CM

## 2019-12-07 LAB — POCT INR: INR: 3.3 — AB (ref 2.0–3.0)

## 2019-12-07 NOTE — Patient Instructions (Signed)
Hold warfarin tonight then resume 1 tablet daily except 1 1/2 tablets on Mondays and Thursdays.  Recheck in 6 weeks.

## 2020-02-14 ENCOUNTER — Other Ambulatory Visit: Payer: Self-pay | Admitting: Cardiology

## 2020-02-14 ENCOUNTER — Ambulatory Visit (INDEPENDENT_AMBULATORY_CARE_PROVIDER_SITE_OTHER): Payer: Self-pay | Admitting: *Deleted

## 2020-02-14 DIAGNOSIS — Z5181 Encounter for therapeutic drug level monitoring: Secondary | ICD-10-CM

## 2020-02-14 DIAGNOSIS — D6862 Lupus anticoagulant syndrome: Secondary | ICD-10-CM

## 2020-02-14 DIAGNOSIS — I2699 Other pulmonary embolism without acute cor pulmonale: Secondary | ICD-10-CM

## 2020-02-14 LAB — POCT INR: INR: 2.4 (ref 2.0–3.0)

## 2020-02-14 NOTE — Patient Instructions (Signed)
Continue warfarin 1 tablet daily except 1 1/2 tablets on Mondays and Thursdays Recheck in 6 weeks  

## 2020-02-24 ENCOUNTER — Other Ambulatory Visit: Payer: Self-pay

## 2020-02-24 ENCOUNTER — Ambulatory Visit
Admission: EM | Admit: 2020-02-24 | Discharge: 2020-02-24 | Disposition: A | Discharge status: Home / Self Care | Payer: Self-pay | Attending: Emergency Medicine | Admitting: Emergency Medicine

## 2020-02-24 ENCOUNTER — Encounter: Payer: Self-pay | Admitting: Emergency Medicine

## 2020-02-24 DIAGNOSIS — S46819A Strain of other muscles, fascia and tendons at shoulder and upper arm level, unspecified arm, initial encounter: Secondary | ICD-10-CM

## 2020-02-24 DIAGNOSIS — M62838 Other muscle spasm: Secondary | ICD-10-CM

## 2020-02-24 DIAGNOSIS — M542 Cervicalgia: Secondary | ICD-10-CM

## 2020-02-24 MED ORDER — CYCLOBENZAPRINE HCL 10 MG PO TABS: 10.0000 mg | ORAL_TABLET | Freq: Every day | ORAL | 0 refills | Status: DC

## 2020-02-24 MED ORDER — PREDNISONE 20 MG PO TABS: 20.0000 mg | ORAL_TABLET | Freq: Two times a day (BID) | ORAL | 0 refills | Status: AC

## 2020-02-24 NOTE — Discharge Instructions (Addendum)
Rest, ice and heat as needed Ensure adequate range of motion as tolerated. Injuries all appear to be muscular in nature at this time Prescribed prednisone Prescribed flexeril as needed at bedtime for muscle spasm.  Do not drive or operate heavy machinery while taking this medication Expect some increased pain in the next 1-3 days.  It may take 3-4 weeks for complete resolution of symptoms Will f/u with his doctor or here if not seeing significant improvement within one week. Return here or go to ER if you have any new or worsening symptoms such as numbness/tingling of the inner thighs, loss of bladder or bowel control, headache/blurry vision, nausea/vomiting, confusion/altered mental status, dizziness, weakness, passing out, imbalance, etc..Marland Kitchen
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
## 2020-03-27 ENCOUNTER — Ambulatory Visit (INDEPENDENT_AMBULATORY_CARE_PROVIDER_SITE_OTHER): Payer: Self-pay | Admitting: *Deleted

## 2020-03-27 DIAGNOSIS — D6862 Lupus anticoagulant syndrome: Secondary | ICD-10-CM

## 2020-03-27 DIAGNOSIS — I2699 Other pulmonary embolism without acute cor pulmonale: Secondary | ICD-10-CM

## 2020-03-27 DIAGNOSIS — Z5181 Encounter for therapeutic drug level monitoring: Secondary | ICD-10-CM

## 2020-03-27 LAB — POCT INR: INR: 3 (ref 2.0–3.0)

## 2020-03-27 NOTE — Patient Instructions (Addendum)
Continue warfarin 1 tablet daily except 1 1/2 tablets on Mondays and Thursdays Recheck in 6 weeks  

## 2020-04-10 ENCOUNTER — Ambulatory Visit: Payer: Self-pay | Admitting: Nurse Practitioner

## 2020-05-10 ENCOUNTER — Ambulatory Visit: Payer: Self-pay | Admitting: Cardiology

## 2020-05-10 ENCOUNTER — Encounter: Payer: Self-pay | Admitting: Cardiology

## 2020-05-10 ENCOUNTER — Ambulatory Visit (INDEPENDENT_AMBULATORY_CARE_PROVIDER_SITE_OTHER): Payer: Self-pay | Admitting: *Deleted

## 2020-05-10 VITALS — BP 106/60 | HR 81 | Resp 16 | Ht 72.0 in | Wt 189.6 lb

## 2020-05-10 DIAGNOSIS — Z720 Tobacco use: Secondary | ICD-10-CM

## 2020-05-10 DIAGNOSIS — Z5181 Encounter for therapeutic drug level monitoring: Secondary | ICD-10-CM

## 2020-05-10 DIAGNOSIS — D6862 Lupus anticoagulant syndrome: Secondary | ICD-10-CM

## 2020-05-10 DIAGNOSIS — I2699 Other pulmonary embolism without acute cor pulmonale: Secondary | ICD-10-CM

## 2020-05-10 DIAGNOSIS — Z7901 Long term (current) use of anticoagulants: Secondary | ICD-10-CM

## 2020-05-10 LAB — POCT INR: INR: 2.6 (ref 2.0–3.0)

## 2020-05-10 NOTE — Progress Notes (Signed)
     Clinical Summary Mr. Donaway is a 50 y.o.male seen today for follow up of the following medical problems.   1. Pulmonary embolism - history of prior pulmonary embolism - history of lupus anticoagulant, on long term anticoagulation.  -  Has not been interested in NOACs due to cost   - no bleeding on coumadin. Remains compliant with dosing and coumaidn checks.  - no receht chest pains, SOB   SH: has no pcp Not interested in covid vaccine.  Works in [employer]  Past Medical History:  Diagnosis Date  . Chronic anticoagulation   . Hepatic steatosis 04/04/2011  . Pulmonary embolism (HCC)    2006     No Known Allergies   Current Outpatient Medications  Medication Sig Dispense Refill  . cyclobenzaprine (FLEXERIL) 10 MG tablet Take 1 tablet (10 mg total) by mouth at bedtime. 15 tablet 0  . warfarin (COUMADIN) 5 MG tablet TAKE ONE AND ONE-HALF TABLETS BY MOUTH ON MONDAY AND THURSDAY AND ONE TABLET DAILY ON ALL OTHER DAYS OR AS DIRECTED 40 tablet 3   No current facility-administered medications for this visit.     Past Surgical History:  Procedure Laterality Date  . CHEST TUBE INSERTION     for pulmonary embolism  . COLONOSCOPY  none   Not yet performed or indicated as of 2012     No Known Allergies    No family history on file.   Social History Mr. Flax reports that he has been smoking cigarettes. He started smoking about 35 years ago. He has been smoking about 1.00 pack per day. He has never used smokeless tobacco. Mr. Vu reports no history of alcohol use.   Review of Systems CONSTITUTIONAL: No weight loss, fever, chills, weakness or fatigue.  HEENT: Eyes: No visual loss, blurred vision, double vision or yellow sclerae.No hearing loss, sneezing, congestion, runny nose or sore throat.  SKIN: No rash or itching.  CARDIOVASCULAR: per hpi RESPIRATORY: No shortness of breath, cough or sputum.  GASTROINTESTINAL: No anorexia, nausea, vomiting or  diarrhea. No abdominal pain or blood.  GENITOURINARY: No burning on urination, no polyuria NEUROLOGICAL: No headache, dizziness, syncope, paralysis, ataxia, numbness or tingling in the extremities. No change in bowel or bladder control.  MUSCULOSKELETAL: No muscle, back pain, joint pain or stiffness.  LYMPHATICS: No enlarged nodes. No history of splenectomy.  PSYCHIATRIC: No history of depression or anxiety.  ENDOCRINOLOGIC: No reports of sweating, cold or heat intolerance. No polyuria or polydipsia.  Marland Kitchen   Physical Examination Today's Vitals   05/10/20 1454  BP: 106/60  Pulse: 81  Resp: 16  SpO2: 99%  Weight: 189 lb 9.6 oz (86 kg)  Height: 6' (1.829 m)   Body mass index is 25.71 kg/m.  Gen: resting comfortably, no acute distress HEENT: no scleral icterus, pupils equal round and reactive, no palptable cervical adenopathy,  CV: RRR, no m/r/g no jvd Resp: Clear to auscultation bilaterally GI: abdomen is soft, non-tender, non-distended, normal bowel sounds, no hepatosplenomegaly MSK: extremities are warm, no edema.  Skin: warm, no rash Neuro:  no focal deficits Psych: appropriate affect   Diagnostic Studies     Assessment and Plan  1. History of PE/Lupus anticoagulant - has done very well on anticogatultion, continue coumadin.   EKG today shows NSR  Order annual labs, encouraged to get a pcp    Antoine Poche, M.D

## 2020-05-10 NOTE — Patient Instructions (Signed)
Medication Instructions:  Your physician recommends that you continue on your current medications as directed. Please refer to the Current Medication list given to you today.  *If you need a refill on your cardiac medications before your next appointment, please call your pharmacy*   Lab Work: Cbc,cmet,tsh,a1c,lipids,magnesium If you have labs (blood work) drawn today and your tests are completely normal, you will receive your results only by: Marland Kitchen MyChart Message (if you have MyChart) OR . A paper copy in the mail If you have any lab test that is abnormal or we need to change your treatment, we will call you to review the results.   Testing/Procedures: None today   Follow-Up: At [facility], you and your health needs are our priority.  As part of our continuing mission to provide you with exceptional heart care, we have created designated Provider Care Teams.  These Care Teams include your primary Cardiologist (physician) and Advanced Practice Providers (APPs -  Physician Assistants and Nurse Practitioners) who all work together to provide you with the care you need, when you need it.  We recommend signing up for the patient portal called "MyChart".  Sign up information is provided on this After Visit Summary.  MyChart is used to connect with patients for Virtual Visits (Telemedicine).  Patients are able to view lab/test results, encounter notes, upcoming appointments, etc.  Non-urgent messages can be sent to your provider as well.   To learn more about what you can do with MyChart, go to [email or web address].    Your next appointment:   12 month(s)  The format for your next appointment:   In Person  Provider:   Dina Rich, MD   Other Instructions [facility]  [phone]        Thank you for choosing [facility] !

## 2020-05-10 NOTE — Patient Instructions (Signed)
Continue warfarin 1 tablet daily except 1 1/2 tablets on Mondays and Thursdays Recheck in 6 weeks  

## 2020-06-21 ENCOUNTER — Ambulatory Visit (INDEPENDENT_AMBULATORY_CARE_PROVIDER_SITE_OTHER): Payer: Self-pay | Admitting: *Deleted

## 2020-06-21 DIAGNOSIS — D6862 Lupus anticoagulant syndrome: Secondary | ICD-10-CM

## 2020-06-21 DIAGNOSIS — I2699 Other pulmonary embolism without acute cor pulmonale: Secondary | ICD-10-CM

## 2020-06-21 DIAGNOSIS — Z5181 Encounter for therapeutic drug level monitoring: Secondary | ICD-10-CM

## 2020-06-21 LAB — POCT INR: INR: 2.4 (ref 2.0–3.0)

## 2020-06-21 NOTE — Patient Instructions (Signed)
Continue warfarin 1 tablet daily except 1 1/2 tablets on Mondays and Thursdays Recheck in 6 weeks  

## 2020-07-18 ENCOUNTER — Other Ambulatory Visit: Payer: Self-pay | Admitting: Cardiology

## 2020-08-03 ENCOUNTER — Ambulatory Visit (INDEPENDENT_AMBULATORY_CARE_PROVIDER_SITE_OTHER): Payer: Self-pay | Admitting: *Deleted

## 2020-08-03 ENCOUNTER — Other Ambulatory Visit: Payer: Self-pay

## 2020-08-03 DIAGNOSIS — D6862 Lupus anticoagulant syndrome: Secondary | ICD-10-CM

## 2020-08-03 DIAGNOSIS — I2699 Other pulmonary embolism without acute cor pulmonale: Secondary | ICD-10-CM

## 2020-08-03 DIAGNOSIS — Z5181 Encounter for therapeutic drug level monitoring: Secondary | ICD-10-CM

## 2020-08-03 LAB — POCT INR: INR: 2.8 (ref 2.0–3.0)

## 2020-08-03 NOTE — Patient Instructions (Signed)
Continue warfarin 1 tablet daily except 1 1/2 tablets on Mondays and Thursdays Recheck in 6 weeks  

## 2020-09-21 ENCOUNTER — Encounter: Payer: Self-pay | Admitting: *Deleted

## 2020-09-28 ENCOUNTER — Ambulatory Visit (INDEPENDENT_AMBULATORY_CARE_PROVIDER_SITE_OTHER): Payer: Self-pay | Admitting: *Deleted

## 2020-09-28 DIAGNOSIS — Z5181 Encounter for therapeutic drug level monitoring: Secondary | ICD-10-CM

## 2020-09-28 DIAGNOSIS — I2699 Other pulmonary embolism without acute cor pulmonale: Secondary | ICD-10-CM

## 2020-09-28 DIAGNOSIS — D6862 Lupus anticoagulant syndrome: Secondary | ICD-10-CM

## 2020-09-28 LAB — POCT INR: INR: 2.8 (ref 2.0–3.0)

## 2020-09-28 NOTE — Patient Instructions (Signed)
Continue warfarin 1 tablet daily except 1 1/2 tablets on Mondays and Thursdays.  Recheck in 7 weeks.

## 2020-11-21 ENCOUNTER — Ambulatory Visit (INDEPENDENT_AMBULATORY_CARE_PROVIDER_SITE_OTHER): Payer: Self-pay | Admitting: *Deleted

## 2020-11-21 ENCOUNTER — Other Ambulatory Visit: Payer: Self-pay

## 2020-11-21 DIAGNOSIS — D6862 Lupus anticoagulant syndrome: Secondary | ICD-10-CM

## 2020-11-21 DIAGNOSIS — I2699 Other pulmonary embolism without acute cor pulmonale: Secondary | ICD-10-CM

## 2020-11-21 DIAGNOSIS — Z5181 Encounter for therapeutic drug level monitoring: Secondary | ICD-10-CM

## 2020-11-21 LAB — POCT INR: INR: 4 — AB (ref 2.0–3.0)

## 2020-11-21 NOTE — Patient Instructions (Signed)
Hold warfarin tonight then resume 1 tablet daily except 1 1/2 tablets on Mondays and Thursdays.  Recheck in 4 weeks.

## 2020-12-27 ENCOUNTER — Telehealth: Payer: Self-pay

## 2020-12-27 NOTE — Telephone Encounter (Signed)
Lmom for overdue inr 

## 2021-01-10 ENCOUNTER — Ambulatory Visit (INDEPENDENT_AMBULATORY_CARE_PROVIDER_SITE_OTHER): Payer: Self-pay | Admitting: *Deleted

## 2021-01-10 DIAGNOSIS — I2699 Other pulmonary embolism without acute cor pulmonale: Secondary | ICD-10-CM

## 2021-01-10 DIAGNOSIS — Z5181 Encounter for therapeutic drug level monitoring: Secondary | ICD-10-CM

## 2021-01-10 DIAGNOSIS — D6862 Lupus anticoagulant syndrome: Secondary | ICD-10-CM

## 2021-01-10 LAB — POCT INR: INR: 3.7 — AB (ref 2.0–3.0)

## 2021-01-10 NOTE — Patient Instructions (Signed)
Hold warfarin tonight then decrease dose to 1 tablet daily  Recheck in 3 weeks 

## 2021-02-01 ENCOUNTER — Ambulatory Visit (INDEPENDENT_AMBULATORY_CARE_PROVIDER_SITE_OTHER): Payer: Self-pay | Admitting: *Deleted

## 2021-02-01 DIAGNOSIS — D6862 Lupus anticoagulant syndrome: Secondary | ICD-10-CM

## 2021-02-01 DIAGNOSIS — I2699 Other pulmonary embolism without acute cor pulmonale: Secondary | ICD-10-CM

## 2021-02-01 DIAGNOSIS — Z5181 Encounter for therapeutic drug level monitoring: Secondary | ICD-10-CM

## 2021-02-01 LAB — POCT INR: INR: 2.5 (ref 2.0–3.0)

## 2021-02-01 NOTE — Patient Instructions (Signed)
Continue warfarin 1 tablet daily.   Recheck in 4 weeks   

## 2021-03-01 ENCOUNTER — Ambulatory Visit (INDEPENDENT_AMBULATORY_CARE_PROVIDER_SITE_OTHER): Payer: Self-pay | Admitting: *Deleted

## 2021-03-01 ENCOUNTER — Other Ambulatory Visit: Payer: Self-pay

## 2021-03-01 DIAGNOSIS — Z5181 Encounter for therapeutic drug level monitoring: Secondary | ICD-10-CM

## 2021-03-01 DIAGNOSIS — D6862 Lupus anticoagulant syndrome: Secondary | ICD-10-CM

## 2021-03-01 DIAGNOSIS — I2699 Other pulmonary embolism without acute cor pulmonale: Secondary | ICD-10-CM

## 2021-03-01 LAB — POCT INR: INR: 2.5 (ref 2.0–3.0)

## 2021-03-01 NOTE — Patient Instructions (Signed)
Description   Continue warfarin 1 tablet daily.  Recheck in 5 weeks.

## 2021-04-26 ENCOUNTER — Ambulatory Visit (INDEPENDENT_AMBULATORY_CARE_PROVIDER_SITE_OTHER): Payer: Self-pay | Admitting: *Deleted

## 2021-04-26 DIAGNOSIS — D6862 Lupus anticoagulant syndrome: Secondary | ICD-10-CM

## 2021-04-26 DIAGNOSIS — I2699 Other pulmonary embolism without acute cor pulmonale: Secondary | ICD-10-CM

## 2021-04-26 DIAGNOSIS — Z5181 Encounter for therapeutic drug level monitoring: Secondary | ICD-10-CM

## 2021-04-26 LAB — POCT INR: INR: 1.7 — AB (ref 2.0–3.0)

## 2021-04-26 NOTE — Patient Instructions (Signed)
Take warfarin 1 1/2 tablets tonight and tomorrow night then resume 1 tablet daily.  Recheck in 2 weeks.

## 2021-05-01 ENCOUNTER — Other Ambulatory Visit: Payer: Self-pay | Admitting: Cardiology

## 2021-05-17 ENCOUNTER — Ambulatory Visit: Payer: Self-pay | Admitting: Cardiology

## 2021-05-17 NOTE — Progress Notes (Deleted)
° ° ° °  Clinical Summary Mr. Nienhaus is a 51 y.o.male seen today for follow up of the following medical problems.    1. Pulmonary embolism - history of prior pulmonary embolism - history of lupus anticoagulant, on long term anticoagulation.   -  Has not been interested in NOACs due to cost     - no bleeding on coumadin. Remains compliant with dosing and coumaidn checks.  - no receht chest pains, SOB     SH: has no pcp  Not interested in covid vaccine.  Works in [employer]   Past Medical History:  Diagnosis Date   Chronic anticoagulation    Hepatic steatosis 04/04/2011   Pulmonary embolism (HCC)    2006     No Known Allergies   Current Outpatient Medications  Medication Sig Dispense Refill   cyclobenzaprine (FLEXERIL) 10 MG tablet Take 1 tablet (10 mg total) by mouth at bedtime. 15 tablet 0   warfarin (COUMADIN) 5 MG tablet Take 1 tablet daily or as directed 40 tablet 5   No current facility-administered medications for this visit.     Past Surgical History:  Procedure Laterality Date   CHEST TUBE INSERTION     for pulmonary embolism   COLONOSCOPY  none   Not yet performed or indicated as of 2012     No Known Allergies    No family history on file.   Social History Mr. Gilson reports that he has been smoking cigarettes. He started smoking about 36 years ago. He has been smoking an average of 1 pack per day. He has never used smokeless tobacco. Mr. Antonini reports no history of alcohol use.   Review of Systems CONSTITUTIONAL: No weight loss, fever, chills, weakness or fatigue.  HEENT: Eyes: No visual loss, blurred vision, double vision or yellow sclerae.No hearing loss, sneezing, congestion, runny nose or sore throat.  SKIN: No rash or itching.  CARDIOVASCULAR:  RESPIRATORY: No shortness of breath, cough or sputum.  GASTROINTESTINAL: No anorexia, nausea, vomiting or diarrhea. No abdominal pain or blood.  GENITOURINARY: No burning on urination, no  polyuria NEUROLOGICAL: No headache, dizziness, syncope, paralysis, ataxia, numbness or tingling in the extremities. No change in bowel or bladder control.  MUSCULOSKELETAL: No muscle, back pain, joint pain or stiffness.  LYMPHATICS: No enlarged nodes. No history of splenectomy.  PSYCHIATRIC: No history of depression or anxiety.  ENDOCRINOLOGIC: No reports of sweating, cold or heat intolerance. No polyuria or polydipsia.  Marland Kitchen   Physical Examination There were no vitals filed for this visit. There were no vitals filed for this visit.  Gen: resting comfortably, no acute distress HEENT: no scleral icterus, pupils equal round and reactive, no palptable cervical adenopathy,  CV Resp: Clear to auscultation bilaterally GI: abdomen is soft, non-tender, non-distended, normal bowel sounds, no hepatosplenomegaly MSK: extremities are warm, no edema.  Skin: warm, no rash Neuro:  no focal deficits Psych: appropriate affect   Diagnostic Studies     Assessment and Plan   History of PE/Lupus anticoagulant - has done very well on anticogatultion, continue coumadin.    EKG today shows NSR   Order annual labs, encouraged to get a pcp     Antoine Poche, M.D., F.A.C.C.

## 2021-07-05 ENCOUNTER — Ambulatory Visit (INDEPENDENT_AMBULATORY_CARE_PROVIDER_SITE_OTHER): Payer: Self-pay | Admitting: *Deleted

## 2021-07-05 DIAGNOSIS — D6862 Lupus anticoagulant syndrome: Secondary | ICD-10-CM

## 2021-07-05 DIAGNOSIS — Z5181 Encounter for therapeutic drug level monitoring: Secondary | ICD-10-CM

## 2021-07-05 DIAGNOSIS — I2699 Other pulmonary embolism without acute cor pulmonale: Secondary | ICD-10-CM

## 2021-07-05 LAB — POCT INR: INR: 3.2 — AB (ref 2.0–3.0)

## 2021-07-05 NOTE — Patient Instructions (Signed)
Took warfarin 2 tablets last night by mistake ?Hold warfarin tonight then resume 1 tablet daily.  ?Recheck in 4 weeks. ?

## 2021-08-14 ENCOUNTER — Ambulatory Visit: Payer: Self-pay | Admitting: Cardiology

## 2021-08-14 NOTE — Progress Notes (Deleted)
? ? ? ?  Clinical Summary ?Mr. Shanholtzer is a 51 y.o.maleseen today for follow up of the following medical problems.  ?  ?1. Pulmonary embolism ?- history of prior pulmonary embolism ?- history of lupus anticoagulant, on long term anticoagulation.   ?-  Has not been interested in NOACs due to cost ?  ?  ?- no bleeding on coumadin. Remains compliant with dosing and coumaidn checks.  ?- no receht chest pains, SOB ?  ?  ?SH: has no pcp ?Works in [employer] ? ? ?Past Medical History:  ?Diagnosis Date  ? Chronic anticoagulation   ? Hepatic steatosis 04/04/2011  ? Pulmonary embolism [facility])   ? 2006  ? ? ? ?No Known Allergies ? ? ?Current Outpatient Medications  ?Medication Sig Dispense Refill  ? cyclobenzaprine (FLEXERIL) 10 MG tablet Take 1 tablet (10 mg total) by mouth at bedtime. 15 tablet 0  ? warfarin (COUMADIN) 5 MG tablet Take 1 tablet daily or as directed 40 tablet 5  ? ?No current facility-administered medications for this visit.  ? ? ? ?Past Surgical History:  ?Procedure Laterality Date  ? CHEST TUBE INSERTION    ? for pulmonary embolism  ? COLONOSCOPY  none  ? Not yet performed or indicated as of 2012  ? ? ? ?No Known Allergies ? ? ? ?No family history on file. ? ? ?Social History ?Mr. Cobos reports that he has been smoking cigarettes. He started smoking about 36 years ago. He has been smoking an average of 1 pack per day. He has never used smokeless tobacco. ?Mr. Fricker reports no history of alcohol use. ? ? ?Review of Systems ?CONSTITUTIONAL: No weight loss, fever, chills, weakness or fatigue.  ?HEENT: Eyes: No visual loss, blurred vision, double vision or yellow sclerae.No hearing loss, sneezing, congestion, runny nose or sore throat.  ?SKIN: No rash or itching.  ?CARDIOVASCULAR:  ?RESPIRATORY: No shortness of breath, cough or sputum.  ?GASTROINTESTINAL: No anorexia, nausea, vomiting or diarrhea. No abdominal pain or blood.  ?GENITOURINARY: No burning on urination, no polyuria ?NEUROLOGICAL: No headache,  dizziness, syncope, paralysis, ataxia, numbness or tingling in the extremities. No change in bowel or bladder control.  ?MUSCULOSKELETAL: No muscle, back pain, joint pain or stiffness.  ?LYMPHATICS: No enlarged nodes. No history of splenectomy.  ?PSYCHIATRIC: No history of depression or anxiety.  ?ENDOCRINOLOGIC: No reports of sweating, cold or heat intolerance. No polyuria or polydipsia.  ?. ? ? ?Physical Examination ?There were no vitals filed for this visit. ?There were no vitals filed for this visit. ? ?Gen: resting comfortably, no acute distress ?HEENT: no scleral icterus, pupils equal round and reactive, no palptable cervical adenopathy,  ?CV ?Resp: Clear to auscultation bilaterally ?GI: abdomen is soft, non-tender, non-distended, normal bowel sounds, no hepatosplenomegaly ?MSK: extremities are warm, no edema.  ?Skin: warm, no rash ?Neuro:  no focal deficits ?Psych: appropriate affect ? ? ?Diagnostic Studies ? ? ? ? ?Assessment and Plan  ?History of PE/Lupus anticoagulant ?- has done very well on anticogatultion, continue coumadin.  ?  ?EKG today shows NSR ?  ?Order annual labs, encouraged to get a pcp ? ? ? ? ? ?Antoine Poche, M.D., F.A.C.C. ?

## 2021-09-03 ENCOUNTER — Ambulatory Visit (INDEPENDENT_AMBULATORY_CARE_PROVIDER_SITE_OTHER): Payer: Self-pay | Admitting: *Deleted

## 2021-09-03 DIAGNOSIS — I2699 Other pulmonary embolism without acute cor pulmonale: Secondary | ICD-10-CM

## 2021-09-03 DIAGNOSIS — Z5181 Encounter for therapeutic drug level monitoring: Secondary | ICD-10-CM

## 2021-09-03 DIAGNOSIS — D6862 Lupus anticoagulant syndrome: Secondary | ICD-10-CM

## 2021-09-03 LAB — POCT INR: INR: 2.8 (ref 2.0–3.0)

## 2021-09-03 NOTE — Patient Instructions (Signed)
Continue warfarin 1 tablet daily.   Recheck in 4 weeks   

## 2021-10-11 ENCOUNTER — Encounter: Payer: Self-pay | Admitting: Cardiology

## 2021-10-11 ENCOUNTER — Ambulatory Visit (INDEPENDENT_AMBULATORY_CARE_PROVIDER_SITE_OTHER): Payer: Self-pay | Admitting: *Deleted

## 2021-10-11 ENCOUNTER — Ambulatory Visit (INDEPENDENT_AMBULATORY_CARE_PROVIDER_SITE_OTHER): Payer: Self-pay | Admitting: Cardiology

## 2021-10-11 VITALS — BP 109/69 | HR 62 | Ht 72.0 in | Wt 174.0 lb

## 2021-10-11 DIAGNOSIS — Z5181 Encounter for therapeutic drug level monitoring: Secondary | ICD-10-CM

## 2021-10-11 DIAGNOSIS — D6862 Lupus anticoagulant syndrome: Secondary | ICD-10-CM

## 2021-10-11 DIAGNOSIS — I2699 Other pulmonary embolism without acute cor pulmonale: Secondary | ICD-10-CM

## 2021-10-11 DIAGNOSIS — Z79899 Other long term (current) drug therapy: Secondary | ICD-10-CM

## 2021-10-11 DIAGNOSIS — Z7901 Long term (current) use of anticoagulants: Secondary | ICD-10-CM

## 2021-10-11 DIAGNOSIS — K760 Fatty (change of) liver, not elsewhere classified: Secondary | ICD-10-CM

## 2021-10-11 LAB — POCT INR: INR: 3.1 — AB (ref 2.0–3.0)

## 2021-10-11 NOTE — Patient Instructions (Signed)
Take warfarin 1/2 tablet tonight then resume 1 tablet daily.  Recheck in 4 weeks.

## 2021-10-11 NOTE — Patient Instructions (Addendum)
Medication Instructions:  Your physician recommends that you continue on your current medications as directed. Please refer to the Current Medication list given to you today.  Labwork: CMET, CBC, TSH, MG, LIPID PANEL & HGA1C Please do not eat or drink for at least 8 hours when you have this done. You may take your medications that morning with a sip of water. [facility] or [employer] or [employer] [address] [place] No appointment necessary  Testing/Procedures: none  Follow-Up: Your physician recommends that you schedule a follow-up appointment in: 1 year. You will receive a reminder call in the mail in about 10 months reminding you to call and schedule your appointment. If you don't receive this call, please contact our office.  Any Other Special Instructions Will Be Listed Below (If Applicable). Call [phone] for assistance finding a primary care provider.  If you need a refill on your cardiac medications before your next appointment, please call your pharmacy.

## 2021-10-11 NOTE — Progress Notes (Signed)
     Clinical Summary Andrew [name] is a 51 y.o.male seen today for follow up of the following medical problems.    1. Pulmonary embolism - history of prior pulmonary embolism - history of lupus anticoagulant, on long term anticoagulation.   -  Has not been interested in NOACs due to cost     - no bleeding on coumadin - no chest pain, no SOB       SH: has no pcp  Not interested in covid vaccine.  Works in [employer] Past Medical History:  Diagnosis Date   Chronic anticoagulation    Hepatic steatosis 04/04/2011   Pulmonary embolism (HCC)    2006     No Known Allergies   Current Outpatient Medications  Medication Sig Dispense Refill   cyclobenzaprine (FLEXERIL) 10 MG tablet Take 1 tablet (10 mg total) by mouth at bedtime. 15 tablet 0   warfarin (COUMADIN) 5 MG tablet Take 1 tablet daily or as directed 40 tablet 5   No current facility-administered medications for this visit.     Past Surgical History:  Procedure Laterality Date   CHEST TUBE INSERTION     for pulmonary embolism   COLONOSCOPY  none   Not yet performed or indicated as of 2012     No Known Allergies    No family history on file.   Social History Mr. Zelek reports that he has been smoking cigarettes. He started smoking about 36 years ago. He has been smoking an average of 1 pack per day. He has never used smokeless tobacco. Mr. Gammel reports no history of alcohol use.   Review of Systems CONSTITUTIONAL: No weight loss, fever, chills, weakness or fatigue.  HEENT: Eyes: No visual loss, blurred vision, double vision or yellow sclerae.No hearing loss, sneezing, congestion, runny nose or sore throat.  SKIN: No rash or itching.  CARDIOVASCULAR: per hpi RESPIRATORY: No shortness of breath, cough or sputum.  GASTROINTESTINAL: No anorexia, nausea, vomiting or diarrhea. No abdominal pain or blood.  GENITOURINARY: No burning on urination, no polyuria NEUROLOGICAL: No headache, dizziness, syncope,  paralysis, ataxia, numbness or tingling in the extremities. No change in bowel or bladder control.  MUSCULOSKELETAL: No muscle, back pain, joint pain or stiffness.  LYMPHATICS: No enlarged nodes. No history of splenectomy.  PSYCHIATRIC: No history of depression or anxiety.  ENDOCRINOLOGIC: No reports of sweating, cold or heat intolerance. No polyuria or polydipsia.  Marland Kitchen   Physical Examination Today's Vitals   10/11/21 1425  BP: 109/69  Pulse: 62  SpO2: 97%  Weight: 174 lb (78.9 kg)  Height: 6' (1.829 m)   Body mass index is 23.6 kg/m.  Gen: resting comfortably, no acute distress HEENT: no scleral icterus, pupils equal round and reactive, no palptable cervical adenopathy,  CV: RRR, no m/r/g no jvd Resp: Clear to auscultation bilaterally GI: abdomen is soft, non-tender, non-distended, normal bowel sounds, no hepatosplenomegaly MSK: extremities are warm, no edema.  Skin: warm, no rash Neuro:  no focal deficits Psych: appropriate affect   Diagnostic Studies     Assessment and Plan  History of PE/Lupus anticoagulant - has done very well on anticogatultion We will continue coumadin  EKG today shows NSR  Obtain annual labs. Encouraged again to obtain a pcp     Antoine Poche, M.D.

## 2021-11-09 ENCOUNTER — Encounter: Payer: Self-pay | Admitting: *Deleted

## 2021-11-26 ENCOUNTER — Ambulatory Visit (INDEPENDENT_AMBULATORY_CARE_PROVIDER_SITE_OTHER): Payer: Self-pay | Admitting: *Deleted

## 2021-11-26 DIAGNOSIS — D6862 Lupus anticoagulant syndrome: Secondary | ICD-10-CM

## 2021-11-26 DIAGNOSIS — I2699 Other pulmonary embolism without acute cor pulmonale: Secondary | ICD-10-CM

## 2021-11-26 DIAGNOSIS — Z5181 Encounter for therapeutic drug level monitoring: Secondary | ICD-10-CM

## 2021-11-26 LAB — POCT INR: INR: 3.5 — AB (ref 2.0–3.0)

## 2021-11-26 NOTE — Patient Instructions (Signed)
Hold warfarin tonight the decrease dose to 1 tablet daily.  Has been taking 1 1/2 tablets on Mondays Recheck in 4 weeks.

## 2021-12-23 ENCOUNTER — Other Ambulatory Visit: Payer: Self-pay | Admitting: Cardiology

## 2021-12-24 NOTE — Telephone Encounter (Signed)
Request for warfarin refill:  Last INR was 3.5 on 11/26/21 Next INR due 12/24/21 LOV was 10/11/21  Dominga Ferry MD  Refill approved.

## 2021-12-26 ENCOUNTER — Ambulatory Visit (INDEPENDENT_AMBULATORY_CARE_PROVIDER_SITE_OTHER): Payer: Self-pay | Admitting: *Deleted

## 2021-12-26 DIAGNOSIS — I2699 Other pulmonary embolism without acute cor pulmonale: Secondary | ICD-10-CM

## 2021-12-26 DIAGNOSIS — D6862 Lupus anticoagulant syndrome: Secondary | ICD-10-CM

## 2021-12-26 DIAGNOSIS — Z5181 Encounter for therapeutic drug level monitoring: Secondary | ICD-10-CM

## 2021-12-26 LAB — POCT INR: INR: 2.3 (ref 2.0–3.0)

## 2021-12-26 NOTE — Patient Instructions (Signed)
Continue warfarin 1 tablet daily.   Recheck in 4 weeks   

## 2022-01-24 ENCOUNTER — Ambulatory Visit: Payer: Self-pay | Attending: Cardiology | Admitting: *Deleted

## 2022-01-24 DIAGNOSIS — Z5181 Encounter for therapeutic drug level monitoring: Secondary | ICD-10-CM

## 2022-01-24 DIAGNOSIS — I2699 Other pulmonary embolism without acute cor pulmonale: Secondary | ICD-10-CM

## 2022-01-24 DIAGNOSIS — D6862 Lupus anticoagulant syndrome: Secondary | ICD-10-CM

## 2022-01-24 LAB — POCT INR: INR: 1.3 — AB (ref 2.0–3.0)

## 2022-01-24 NOTE — Patient Instructions (Signed)
Description   Take 1.5 tablets of warfarin today and tomorrow, then Continue warfarin 1 tablet daily.   Recheck in 2 weeks.

## 2022-02-12 ENCOUNTER — Ambulatory Visit: Payer: Self-pay | Attending: Cardiology | Admitting: *Deleted

## 2022-02-12 DIAGNOSIS — I2699 Other pulmonary embolism without acute cor pulmonale: Secondary | ICD-10-CM

## 2022-02-12 DIAGNOSIS — D6862 Lupus anticoagulant syndrome: Secondary | ICD-10-CM

## 2022-02-12 DIAGNOSIS — Z5181 Encounter for therapeutic drug level monitoring: Secondary | ICD-10-CM

## 2022-02-12 LAB — POCT INR: INR: 3.5 — AB (ref 2.0–3.0)

## 2022-02-12 NOTE — Patient Instructions (Signed)
Hold warfarin tonight then resume warfarin 1 tablet daily.   Recheck in 3 weeks.

## 2022-03-05 ENCOUNTER — Ambulatory Visit: Payer: Self-pay | Attending: Cardiology | Admitting: *Deleted

## 2022-03-05 DIAGNOSIS — Z5181 Encounter for therapeutic drug level monitoring: Secondary | ICD-10-CM

## 2022-03-05 DIAGNOSIS — I2699 Other pulmonary embolism without acute cor pulmonale: Secondary | ICD-10-CM

## 2022-03-05 DIAGNOSIS — D6862 Lupus anticoagulant syndrome: Secondary | ICD-10-CM

## 2022-03-05 LAB — POCT INR: INR: 3.8 — AB (ref 2.0–3.0)

## 2022-03-05 NOTE — Patient Instructions (Signed)
Hold warfarin tonight then decrease dose to 1 tablet daily except 1/2 tablet on Mondays and Thursdays Recheck in 3 weeks 

## 2022-04-08 ENCOUNTER — Ambulatory Visit: Payer: Self-pay | Attending: Cardiology | Admitting: *Deleted

## 2022-04-08 DIAGNOSIS — D6862 Lupus anticoagulant syndrome: Secondary | ICD-10-CM

## 2022-04-08 DIAGNOSIS — Z5181 Encounter for therapeutic drug level monitoring: Secondary | ICD-10-CM

## 2022-04-08 DIAGNOSIS — I2699 Other pulmonary embolism without acute cor pulmonale: Secondary | ICD-10-CM

## 2022-04-08 LAB — POCT INR: INR: 2.1 (ref 2.0–3.0)

## 2022-04-08 NOTE — Patient Instructions (Signed)
Continue warfarin 1 tablet daily except 1/2 tablet on Mondays and Thursdays Recheck in 4 weeks 

## 2022-06-17 ENCOUNTER — Ambulatory Visit: Payer: Commercial Managed Care - HMO | Attending: Cardiology | Admitting: *Deleted

## 2022-06-17 DIAGNOSIS — Z5181 Encounter for therapeutic drug level monitoring: Secondary | ICD-10-CM | POA: Diagnosis not present

## 2022-06-17 DIAGNOSIS — D6862 Lupus anticoagulant syndrome: Secondary | ICD-10-CM | POA: Diagnosis not present

## 2022-06-17 DIAGNOSIS — I2699 Other pulmonary embolism without acute cor pulmonale: Secondary | ICD-10-CM | POA: Diagnosis not present

## 2022-06-17 LAB — POCT INR: INR: 2.2 (ref 2.0–3.0)

## 2022-06-17 NOTE — Patient Instructions (Signed)
Continue warfarin 1 tablet daily except 1/2 tablet on Mondays and Thursdays.   Recheck in 4 weeks.

## 2022-07-15 ENCOUNTER — Ambulatory Visit: Payer: Commercial Managed Care - HMO | Attending: Cardiology

## 2022-07-23 ENCOUNTER — Ambulatory Visit: Payer: Commercial Managed Care - HMO | Attending: Cardiology | Admitting: *Deleted

## 2022-07-23 DIAGNOSIS — Z5181 Encounter for therapeutic drug level monitoring: Secondary | ICD-10-CM

## 2022-07-23 DIAGNOSIS — D6862 Lupus anticoagulant syndrome: Secondary | ICD-10-CM | POA: Diagnosis not present

## 2022-07-23 DIAGNOSIS — I2699 Other pulmonary embolism without acute cor pulmonale: Secondary | ICD-10-CM

## 2022-07-23 LAB — POCT INR: INR: 2.6 (ref 2.0–3.0)

## 2022-07-23 NOTE — Patient Instructions (Signed)
Continue warfarin 1 tablet daily except 1/2 tablet on Mondays and Thursdays.   Recheck in 6 weeks.

## 2022-09-03 ENCOUNTER — Ambulatory Visit: Payer: Commercial Managed Care - HMO | Attending: Cardiology | Admitting: *Deleted

## 2022-09-03 DIAGNOSIS — I2699 Other pulmonary embolism without acute cor pulmonale: Secondary | ICD-10-CM

## 2022-09-03 DIAGNOSIS — D6862 Lupus anticoagulant syndrome: Secondary | ICD-10-CM

## 2022-09-03 DIAGNOSIS — Z5181 Encounter for therapeutic drug level monitoring: Secondary | ICD-10-CM | POA: Diagnosis not present

## 2022-09-03 LAB — POCT INR: INR: 2.4 (ref 2.0–3.0)

## 2022-09-03 NOTE — Patient Instructions (Signed)
Continue warfarin 1 tablet daily except 1/2 tablet on Mondays and Thursdays.   Recheck in 6 weeks. 

## 2022-10-25 ENCOUNTER — Other Ambulatory Visit: Payer: Self-pay | Admitting: Cardiology

## 2022-10-25 NOTE — Telephone Encounter (Addendum)
Warfarin 5mg  refill PE Last INR 09/03/22-NEEDS APPT Last OV 10/11/21 and has an appt pending 11/05/22  Called pt and there was no answer and voicemail is full. Will call back at a later time/date.

## 2022-10-31 ENCOUNTER — Ambulatory Visit: Payer: Self-pay | Admitting: Nurse Practitioner

## 2022-11-05 ENCOUNTER — Encounter: Payer: Self-pay | Admitting: Nurse Practitioner

## 2022-11-05 ENCOUNTER — Ambulatory Visit (INDEPENDENT_AMBULATORY_CARE_PROVIDER_SITE_OTHER): Payer: Commercial Managed Care - HMO | Admitting: *Deleted

## 2022-11-05 ENCOUNTER — Ambulatory Visit: Payer: Commercial Managed Care - HMO | Attending: Nurse Practitioner | Admitting: Nurse Practitioner

## 2022-11-05 VITALS — BP 110/70 | HR 65 | Ht 72.0 in | Wt 169.6 lb

## 2022-11-05 DIAGNOSIS — Z136 Encounter for screening for cardiovascular disorders: Secondary | ICD-10-CM | POA: Diagnosis not present

## 2022-11-05 DIAGNOSIS — Z720 Tobacco use: Secondary | ICD-10-CM | POA: Diagnosis not present

## 2022-11-05 DIAGNOSIS — Z5181 Encounter for therapeutic drug level monitoring: Secondary | ICD-10-CM

## 2022-11-05 DIAGNOSIS — Z86711 Personal history of pulmonary embolism: Secondary | ICD-10-CM | POA: Diagnosis not present

## 2022-11-05 DIAGNOSIS — I2699 Other pulmonary embolism without acute cor pulmonale: Secondary | ICD-10-CM | POA: Diagnosis not present

## 2022-11-05 DIAGNOSIS — D6862 Lupus anticoagulant syndrome: Secondary | ICD-10-CM

## 2022-11-05 LAB — POCT INR: INR: 2.5 (ref 2.0–3.0)

## 2022-11-05 NOTE — Patient Instructions (Addendum)
Medication Instructions:  none  Labwork: Requesting from PCP  Testing/Procedures: none  Follow-Up: Your physician recommends that you schedule a follow-up appointment in: 1 Year with Branch  Any Other Special Instructions Will Be Listed Below (If Applicable).  If you need a refill on your cardiac medications before your next appointment, please call your pharmacy.

## 2022-11-05 NOTE — Patient Instructions (Signed)
Continue warfarin 1 tablet daily except 1/2 tablet on Mondays and Thursdays.   Recheck in 6 weeks. 

## 2022-11-05 NOTE — Progress Notes (Signed)
  Cardiology Office Note:  .   Date:  11/05/2022  ID:  Andrew [name], DOB [birth year], MRN [id number] PCP: Patient, No Pcp Per  [facility] Providers Cardiologist:  Dina Rich, MD    History of Present Illness: .   Andrew [name] is a 52 y.o. male with a PMH of PE, hx of lupus anticoagulant disorder, tobacco abuse, and hepatic steatosis, who presents today for 1 year follow-up.   Last seen by Dr. Dina Rich on October 11, 2021. Was doing well at the time.   Today he presents for 1 year follow-up. He states he is doing well. Denies any chest pain, shortness of breath, palpitations, syncope, presyncope, dizziness, orthopnea, PND, swelling or significant weight changes, acute bleeding, or claudication. Smokes around 1 PPD. Follows [facility] in [place], [place].   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      Physical Exam:   VS:  BP 110/70   Pulse 65   Ht 6' (1.829 m)   Wt 169 lb 9.6 oz (76.9 kg)   SpO2 96%   BMI 23.00 kg/m    Wt Readings from Last 3 Encounters:  11/05/22 169 lb 9.6 oz (76.9 kg)  10/11/21 174 lb (78.9 kg)  05/10/20 189 lb 9.6 oz (86 kg)    GEN: Well nourished, well developed in no acute distress NECK: No JVD; No carotid bruits CARDIAC: S1/S2, RRR, no murmurs, rubs, gallops RESPIRATORY:  Clear to auscultation without rales, wheezing or rhonchi  ABDOMEN: Soft, non-tender, non-distended EXTREMITIES:  No edema; No deformity   ASSESSMENT AND PLAN: .    Screening for cardiovascular condition Does have some risk factors for CVD and family hx of CVD, states mom had CVD, unsure about diagnoses. Offered coronary calcium scoring CT test, pt declines and states would like to think about it. Heart healthy diet and regular cardiovascular exercise encouraged. Smoking cessation encouraged and discussed. Will obtain labs from PCP's office - states PCP office [facility] at [facility].   History of PE, lupus anticoagulant disorder Denies any bleeding issues while on Coumadin. Continue to follow-up with [facility] Clinic. Will obtain labs from PCP's office.   Tobacco abuse Smoking cessation encouraged and discussed.   Dispo: Follow-up with Dr. Dina Rich or APP in 1 year or sooner if anything changes.   Signed, Sharlene Dory, NP

## 2022-12-10 ENCOUNTER — Other Ambulatory Visit: Payer: Self-pay | Admitting: Cardiology

## 2022-12-10 NOTE — Telephone Encounter (Signed)
Refill request for warfarin:  Last INR was 2.5 ob 11/05/22 Next INR due 12/17/22 LOV was 11/05/22  Refill approved.

## 2022-12-17 ENCOUNTER — Ambulatory Visit: Payer: Commercial Managed Care - HMO | Attending: Cardiology | Admitting: *Deleted

## 2022-12-17 DIAGNOSIS — I2699 Other pulmonary embolism without acute cor pulmonale: Secondary | ICD-10-CM

## 2022-12-17 DIAGNOSIS — Z5181 Encounter for therapeutic drug level monitoring: Secondary | ICD-10-CM

## 2022-12-17 DIAGNOSIS — D6862 Lupus anticoagulant syndrome: Secondary | ICD-10-CM

## 2022-12-17 LAB — POCT INR: INR: 1.9 — AB (ref 2.0–3.0)

## 2022-12-17 NOTE — Patient Instructions (Signed)
Take warfarin 1 1/2 tablets tonight then resume 1 tablet daily except 1/2 tablet on Mondays and Thursdays.   Recheck in 6 weeks.

## 2023-01-28 ENCOUNTER — Ambulatory Visit: Payer: Commercial Managed Care - HMO | Attending: Cardiology | Admitting: *Deleted

## 2023-01-28 DIAGNOSIS — I2699 Other pulmonary embolism without acute cor pulmonale: Secondary | ICD-10-CM

## 2023-01-28 DIAGNOSIS — D6862 Lupus anticoagulant syndrome: Secondary | ICD-10-CM

## 2023-01-28 DIAGNOSIS — Z5181 Encounter for therapeutic drug level monitoring: Secondary | ICD-10-CM

## 2023-01-28 LAB — POCT INR: INR: 2.1 (ref 2.0–3.0)

## 2023-01-28 NOTE — Patient Instructions (Signed)
Continue warfarin 1 tablet daily except 1/2 tablet on Mondays and Thursdays.   Recheck in 6 weeks.

## 2023-03-11 ENCOUNTER — Encounter: Payer: Self-pay | Admitting: *Deleted

## 2023-03-24 ENCOUNTER — Ambulatory Visit: Payer: Managed Care, Other (non HMO) | Attending: Cardiology | Admitting: *Deleted

## 2023-03-24 DIAGNOSIS — Z5181 Encounter for therapeutic drug level monitoring: Secondary | ICD-10-CM | POA: Diagnosis not present

## 2023-03-24 DIAGNOSIS — D6862 Lupus anticoagulant syndrome: Secondary | ICD-10-CM

## 2023-03-24 DIAGNOSIS — I2699 Other pulmonary embolism without acute cor pulmonale: Secondary | ICD-10-CM

## 2023-03-24 LAB — POCT INR: INR: 1.8 — AB (ref 2.0–3.0)

## 2023-03-24 NOTE — Patient Instructions (Signed)
Take warfarin 1 tablet tonight then resume 1 tablet daily except 1/2 tablet on Mondays and Thursdays.   Recheck in 4 wks

## 2023-04-21 ENCOUNTER — Ambulatory Visit: Payer: Commercial Managed Care - HMO | Attending: Cardiology

## 2023-07-03 ENCOUNTER — Ambulatory Visit: Payer: Commercial Managed Care - HMO | Attending: Cardiology | Admitting: *Deleted

## 2023-07-03 DIAGNOSIS — Z5181 Encounter for therapeutic drug level monitoring: Secondary | ICD-10-CM

## 2023-07-03 DIAGNOSIS — D6862 Lupus anticoagulant syndrome: Secondary | ICD-10-CM

## 2023-07-03 DIAGNOSIS — I2699 Other pulmonary embolism without acute cor pulmonale: Secondary | ICD-10-CM

## 2023-07-03 LAB — POCT INR: INR: 2.5 (ref 2.0–3.0)

## 2023-07-03 NOTE — Patient Instructions (Signed)
 Continue warfarin 1 tablet daily except 1/2 tablet on Mondays and Thursdays.  Recheck in 6 wks

## 2023-08-12 ENCOUNTER — Ambulatory Visit: Payer: Commercial Managed Care - HMO | Attending: Cardiology

## 2023-09-04 ENCOUNTER — Other Ambulatory Visit: Payer: Self-pay | Admitting: Cardiology

## 2023-09-04 DIAGNOSIS — I2699 Other pulmonary embolism without acute cor pulmonale: Secondary | ICD-10-CM

## 2023-09-04 NOTE — Telephone Encounter (Signed)
 Prescription refill request received for warfarin Lov:  11/05/22 Andrew [name])  Next INR check: 08/12/23 Warfarin tablet strength: 5mg   Appropriate dose. Refill sent.

## 2023-10-13 ENCOUNTER — Ambulatory Visit: Payer: Self-pay | Attending: Internal Medicine | Admitting: *Deleted

## 2023-10-13 DIAGNOSIS — Z5181 Encounter for therapeutic drug level monitoring: Secondary | ICD-10-CM

## 2023-10-13 DIAGNOSIS — D6862 Lupus anticoagulant syndrome: Secondary | ICD-10-CM

## 2023-10-13 DIAGNOSIS — I2699 Other pulmonary embolism without acute cor pulmonale: Secondary | ICD-10-CM

## 2023-10-13 LAB — POCT INR: INR: 4.2 — AB (ref 2.0–3.0)

## 2023-10-13 NOTE — Patient Instructions (Signed)
 Hold warfarin tonight, take 1/2 tablet tomorrow night then resume 1 tablet daily except 1/2 tablet on Mondays and Thursdays.   Recheck in 3 wks

## 2023-11-03 ENCOUNTER — Ambulatory Visit: Payer: Self-pay

## 2023-11-03 ENCOUNTER — Encounter: Payer: Self-pay | Admitting: *Deleted

## 2023-11-11 ENCOUNTER — Ambulatory Visit: Payer: Self-pay | Attending: Cardiology | Admitting: *Deleted

## 2023-11-11 DIAGNOSIS — I2699 Other pulmonary embolism without acute cor pulmonale: Secondary | ICD-10-CM

## 2023-11-11 DIAGNOSIS — Z5181 Encounter for therapeutic drug level monitoring: Secondary | ICD-10-CM

## 2023-11-11 DIAGNOSIS — D6862 Lupus anticoagulant syndrome: Secondary | ICD-10-CM

## 2023-11-11 LAB — POCT INR: INR: 3 (ref 2.0–3.0)

## 2023-11-11 NOTE — Patient Instructions (Signed)
 Continue warfarin 1 tablet daily except 1/2 tablet on Mondays and Thursdays.   Increase greens and salads Recheck in 4 wks

## 2023-11-17 NOTE — Progress Notes (Signed)
Please see anticoagulation encounter.

## 2023-11-22 ENCOUNTER — Other Ambulatory Visit: Payer: Self-pay | Admitting: Cardiology

## 2023-11-22 DIAGNOSIS — I2699 Other pulmonary embolism without acute cor pulmonale: Secondary | ICD-10-CM

## 2023-12-09 ENCOUNTER — Encounter: Payer: Self-pay | Admitting: *Deleted

## 2023-12-09 ENCOUNTER — Ambulatory Visit: Payer: Self-pay | Attending: Cardiology

## 2023-12-23 ENCOUNTER — Ambulatory Visit: Payer: Self-pay | Attending: Cardiology | Admitting: Cardiology

## 2023-12-23 NOTE — Progress Notes (Deleted)
      Clinical Summary Mr. Kinn is a 53 y.o.male  seen today for follow up of the following medical problems.    1. Pulmonary embolism - history of prior pulmonary embolism - history of lupus anticoagulant, on long term anticoagulation.   -  Has not been interested in NOACs due to cost       - no bleeding on coumadin  - no chest pain, no SOB     Past Medical History:  Diagnosis Date   Chronic anticoagulation    Hepatic steatosis 04/04/2011   Pulmonary embolism (HCC)    2006     No Known Allergies   Current Outpatient Medications  Medication Sig Dispense Refill   warfarin (COUMADIN ) 5 MG tablet TAKE 1/2 TO 1 (ONE-HALF TO ONE) TABLET BY MOUTH ONCE DAILY OR  AS  DIRECTED 75 tablet 0   No current facility-administered medications for this visit.     Past Surgical History:  Procedure Laterality Date   CHEST TUBE INSERTION     for pulmonary embolism   COLONOSCOPY  none   Not yet performed or indicated as of 2012     No Known Allergies    No family history on file.   Social History Mr. Gruenwald reports that he has been smoking cigarettes. He started smoking about 38 years ago. He has a 38.6 pack-year smoking history. He has never used smokeless tobacco. Mr. Deshler reports no history of alcohol use.   Review of Systems CONSTITUTIONAL: No weight loss, fever, chills, weakness or fatigue.  HEENT: Eyes: No visual loss, blurred vision, double vision or yellow sclerae.No hearing loss, sneezing, congestion, runny nose or sore throat.  SKIN: No rash or itching.  CARDIOVASCULAR:  RESPIRATORY: No shortness of breath, cough or sputum.  GASTROINTESTINAL: No anorexia, nausea, vomiting or diarrhea. No abdominal pain or blood.  GENITOURINARY: No burning on urination, no polyuria NEUROLOGICAL: No headache, dizziness, syncope, paralysis, ataxia, numbness or tingling in the extremities. No change in bowel or bladder control.  MUSCULOSKELETAL: No muscle, back pain, joint pain  or stiffness.  LYMPHATICS: No enlarged nodes. No history of splenectomy.  PSYCHIATRIC: No history of depression or anxiety.  ENDOCRINOLOGIC: No reports of sweating, cold or heat intolerance. No polyuria or polydipsia.  SABRA   Physical Examination There were no vitals filed for this visit. There were no vitals filed for this visit.  Gen: resting comfortably, no acute distress HEENT: no scleral icterus, pupils equal round and reactive, no palptable cervical adenopathy,  CV Resp: Clear to auscultation bilaterally GI: abdomen is soft, non-tender, non-distended, normal bowel sounds, no hepatosplenomegaly MSK: extremities are warm, no edema.  Skin: warm, no rash Neuro:  no focal deficits Psych: appropriate affect   Diagnostic Studies     Assessment and Plan  History of PE/Lupus anticoagulant - has done very well on anticogatultion We will continue coumadin    EKG today shows NSR   Obtain annual labs. Encouraged again to obtain a pcp      Dorn PHEBE Ross, M.D., F.A.C.C.

## 2024-01-01 ENCOUNTER — Telehealth: Payer: Self-pay | Admitting: Cardiology

## 2024-01-01 NOTE — Telephone Encounter (Signed)
 Calling to see how patient medication is suppose to be distribute to him. Please advise

## 2024-01-01 NOTE — Telephone Encounter (Signed)
 Called and spoke to [place] from the [facility] and informed her the last dosing instructions that we gave pt were on 11/11/2023 and the instructions were for pt to take 5 mg  daily expect for 2.5 mg on Mondays and Thursdays.

## 2024-01-01 NOTE — Telephone Encounter (Signed)
 [place] at [facility] wants to clarify patient warfarin dosage before giving it to him. She stated patient has not been seen by us  in awhile so she wasn't sure of correct dosing. Advised her that I would route to our Anticoag team to call and advise her of the correct dosing for patient.

## 2024-02-26 ENCOUNTER — Other Ambulatory Visit: Payer: Self-pay | Admitting: Cardiology

## 2024-02-26 DIAGNOSIS — I2699 Other pulmonary embolism without acute cor pulmonale: Secondary | ICD-10-CM

## 2024-02-27 NOTE — Telephone Encounter (Signed)
 Warfarin 5mg  refill PE Last INR 11/11/23-OVERDUE & pending appt on 03/01/24 Last OV 11/05/22 & pending appt 04/27/24  4 DAY SUPPLY SENT AT THIS TIME AND NOTE TO KEEP UPCOMING APPOINTMENT.

## 2024-03-01 ENCOUNTER — Ambulatory Visit: Payer: Self-pay

## 2024-03-03 ENCOUNTER — Telehealth: Payer: Self-pay | Admitting: Cardiology

## 2024-03-03 ENCOUNTER — Other Ambulatory Visit: Payer: Self-pay

## 2024-03-03 DIAGNOSIS — I2699 Other pulmonary embolism without acute cor pulmonale: Secondary | ICD-10-CM

## 2024-03-03 MED ORDER — WARFARIN SODIUM 5 MG PO TABS: ORAL_TABLET | ORAL | 0 refills | Status: DC

## 2024-03-03 NOTE — Telephone Encounter (Signed)
 1. Which medications need to be refilled? (please list name of each medication and dose if known) Coumadin   2. Which pharmacy/location (including street and city if local pharmacy) is medication to be sent to? [facility] [place]   3. Do they need a 30 day or 90 day supply? 30

## 2024-03-08 ENCOUNTER — Ambulatory Visit: Payer: Self-pay

## 2024-03-08 ENCOUNTER — Ambulatory Visit: Payer: Self-pay | Attending: Internal Medicine | Admitting: *Deleted

## 2024-03-08 DIAGNOSIS — D6862 Lupus anticoagulant syndrome: Secondary | ICD-10-CM

## 2024-03-08 DIAGNOSIS — I2699 Other pulmonary embolism without acute cor pulmonale: Secondary | ICD-10-CM

## 2024-03-08 DIAGNOSIS — Z5181 Encounter for therapeutic drug level monitoring: Secondary | ICD-10-CM

## 2024-03-08 LAB — POCT INR: INR: 2.2 (ref 2.0–3.0)

## 2024-03-08 MED ORDER — WARFARIN SODIUM 5 MG PO TABS: ORAL_TABLET | ORAL | 5 refills | Status: DC

## 2024-03-08 NOTE — Progress Notes (Signed)
 INR 2.2; Please see anticoagulation encounter

## 2024-03-08 NOTE — Patient Instructions (Signed)
 Continue warfarin 1 tablet daily except 1/2 tablet on Mondays and Thursdays.   Continue greens and salads Recheck in 4 wks

## 2024-04-05 ENCOUNTER — Ambulatory Visit: Payer: Self-pay | Attending: Cardiology

## 2024-04-20 ENCOUNTER — Ambulatory Visit: Attending: Cardiology

## 2024-04-27 ENCOUNTER — Ambulatory Visit: Payer: Self-pay | Admitting: Cardiology

## 2024-04-30 ENCOUNTER — Ambulatory Visit: Payer: Self-pay | Admitting: Cardiology

## 2024-05-03 ENCOUNTER — Telehealth: Payer: Self-pay | Admitting: Cardiology

## 2024-05-03 DIAGNOSIS — I2699 Other pulmonary embolism without acute cor pulmonale: Secondary | ICD-10-CM

## 2024-05-03 MED ORDER — WARFARIN SODIUM 5 MG PO TABS: ORAL_TABLET | ORAL | 3 refills | Status: AC

## 2024-05-03 NOTE — Telephone Encounter (Signed)
 Refill request for warfarin:  Last INR was 2.2 on 03/08/24 Next INR due 05/04/24 Has OV on 06/14/24  Refill approved.

## 2024-05-03 NOTE — Telephone Encounter (Signed)
" °*  STAT* If patient is at the pharmacy, call can be transferred to refill team.   1. Which medications need to be refilled? (please list name of each medication and dose if known)   warfarin (COUMADIN ) 5 MG tablet   2. Would you like to learn more about the convenience, safety, & potential cost savings by using the [facility]?   3. Are you open to using the [facility] (Type [facility]. ).  4. Which pharmacy/location (including street and city if local pharmacy) is medication to be sent to?  [facility] [address] [place] - [address]   5. Do they need a 30 day or 90 day supply?   30 day  Patient stated he is completely out of this medication.  Patient noted his dog broke his medication bottle.  Patient has appointment scheduled with Dr. Alvan on 2/6. "

## 2024-05-04 ENCOUNTER — Ambulatory Visit

## 2024-06-08 ENCOUNTER — Ambulatory Visit

## 2024-06-11 ENCOUNTER — Ambulatory Visit: Admitting: Cardiology

## 2024-06-11 NOTE — Progress Notes (Unsigned)
" ° ° ° ° °  Clinical Summary Mr. Gulas is a 54 y.o.male  seen today for follow up of the following medical problems.    1. Pulmonary embolism - history of prior pulmonary embolism - history of lupus anticoagulant, on long term anticoagulation.   -  Has not been interested in NOACs due to cost       - no bleeding on coumadin  - no chest pain, no SOB       Past Medical History:  Diagnosis Date   Chronic anticoagulation    Hepatic steatosis 04/04/2011   Pulmonary embolism (HCC)    2006     Allergies[1]   Current Outpatient Medications  Medication Sig Dispense Refill   warfarin (COUMADIN ) 5 MG tablet TAKE 1/2 TO 1 (ONE-HALF TO ONE) TABLET BY MOUTH ONCE DAILY OR  AS  DIRECTED PLEASE KEEP UPCOMING APPOINTMENT. 30 tablet 3   No current facility-administered medications for this visit.     Past Surgical History:  Procedure Laterality Date   CHEST TUBE INSERTION     for pulmonary embolism   COLONOSCOPY  none   Not yet performed or indicated as of 2012     Allergies[2]    No family history on file.   Social History Mr. Gilmer reports that he has been smoking cigarettes. He started smoking about 39 years ago. He has a 39.1 pack-year smoking history. He has never used smokeless tobacco. Mr. Mcnee reports no history of alcohol use.   Review of Systems CONSTITUTIONAL: No weight loss, fever, chills, weakness or fatigue.  HEENT: Eyes: No visual loss, blurred vision, double vision or yellow sclerae.No hearing loss, sneezing, congestion, runny nose or sore throat.  SKIN: No rash or itching.  CARDIOVASCULAR:  RESPIRATORY: No shortness of breath, cough or sputum.  GASTROINTESTINAL: No anorexia, nausea, vomiting or diarrhea. No abdominal pain or blood.  GENITOURINARY: No burning on urination, no polyuria NEUROLOGICAL: No headache, dizziness, syncope, paralysis, ataxia, numbness or tingling in the extremities. No change in bowel or bladder control.  MUSCULOSKELETAL: No  muscle, back pain, joint pain or stiffness.  LYMPHATICS: No enlarged nodes. No history of splenectomy.  PSYCHIATRIC: No history of depression or anxiety.  ENDOCRINOLOGIC: No reports of sweating, cold or heat intolerance. No polyuria or polydipsia.  SABRA   Physical Examination There were no vitals filed for this visit. There were no vitals filed for this visit.  Gen: resting comfortably, no acute distress HEENT: no scleral icterus, pupils equal round and reactive, no palptable cervical adenopathy,  CV Resp: Clear to auscultation bilaterally GI: abdomen is soft, non-tender, non-distended, normal bowel sounds, no hepatosplenomegaly MSK: extremities are warm, no edema.  Skin: warm, no rash Neuro:  no focal deficits Psych: appropriate affect   Diagnostic Studies     Assessment and Plan   History of PE/Lupus anticoagulant - has done very well on anticogatultion We will continue coumadin        Dorn PHEBE Ross, M.D., F.A.C.C.    [1] No Known Allergies [2] No Known Allergies  "

## 2024-06-21 ENCOUNTER — Ambulatory Visit

## 2024-09-01 ENCOUNTER — Ambulatory Visit: Admitting: Cardiology
